# Patient Record
Sex: Male | Born: 1950 | Race: White | Hispanic: No | Marital: Married | Smoking: Current every day smoker
Health system: Southern US, Community
[De-identification: ages and names within clinical notes are randomized; demographics above are authoritative.]

## PROBLEM LIST (undated history)

## (undated) DIAGNOSIS — I10 Essential (primary) hypertension: Secondary | ICD-10-CM

## (undated) DIAGNOSIS — Z720 Tobacco use: Secondary | ICD-10-CM

## (undated) DIAGNOSIS — T783XXA Angioneurotic edema, initial encounter: Secondary | ICD-10-CM

## (undated) HISTORY — DX: Angioneurotic edema, initial encounter: T78.3XXA

## (undated) HISTORY — DX: Tobacco use: Z72.0

## (undated) HISTORY — DX: Essential (primary) hypertension: I10

## (undated) HISTORY — PX: APPENDECTOMY: SHX54

---

## 2001-07-21 ENCOUNTER — Inpatient Hospital Stay (HOSPITAL_COMMUNITY): Admission: EM | Admit: 2001-07-21 | Discharge: 2001-08-01 | Payer: Self-pay | Admitting: *Deleted

## 2001-07-31 ENCOUNTER — Encounter: Payer: Self-pay | Admitting: General Surgery

## 2015-05-26 ENCOUNTER — Other Ambulatory Visit: Payer: Self-pay | Admitting: Gastroenterology

## 2015-09-12 DIAGNOSIS — H6092 Unspecified otitis externa, left ear: Secondary | ICD-10-CM | POA: Diagnosis not present

## 2015-11-25 DIAGNOSIS — F418 Other specified anxiety disorders: Secondary | ICD-10-CM | POA: Diagnosis not present

## 2015-11-25 DIAGNOSIS — N401 Enlarged prostate with lower urinary tract symptoms: Secondary | ICD-10-CM | POA: Diagnosis not present

## 2015-11-25 DIAGNOSIS — Z79899 Other long term (current) drug therapy: Secondary | ICD-10-CM | POA: Diagnosis not present

## 2015-11-25 DIAGNOSIS — Z125 Encounter for screening for malignant neoplasm of prostate: Secondary | ICD-10-CM | POA: Diagnosis not present

## 2015-11-25 DIAGNOSIS — I10 Essential (primary) hypertension: Secondary | ICD-10-CM | POA: Diagnosis not present

## 2015-11-25 DIAGNOSIS — Z1211 Encounter for screening for malignant neoplasm of colon: Secondary | ICD-10-CM | POA: Diagnosis not present

## 2016-04-27 DIAGNOSIS — L02811 Cutaneous abscess of head [any part, except face]: Secondary | ICD-10-CM | POA: Diagnosis not present

## 2016-04-27 DIAGNOSIS — L0390 Cellulitis, unspecified: Secondary | ICD-10-CM | POA: Diagnosis not present

## 2016-04-27 DIAGNOSIS — C44629 Squamous cell carcinoma of skin of left upper limb, including shoulder: Secondary | ICD-10-CM | POA: Diagnosis not present

## 2016-06-01 DIAGNOSIS — G4733 Obstructive sleep apnea (adult) (pediatric): Secondary | ICD-10-CM | POA: Diagnosis not present

## 2016-06-01 DIAGNOSIS — R5383 Other fatigue: Secondary | ICD-10-CM | POA: Diagnosis not present

## 2016-06-01 DIAGNOSIS — R0600 Dyspnea, unspecified: Secondary | ICD-10-CM | POA: Diagnosis not present

## 2016-06-02 DIAGNOSIS — G4733 Obstructive sleep apnea (adult) (pediatric): Secondary | ICD-10-CM | POA: Diagnosis not present

## 2016-06-08 DIAGNOSIS — E559 Vitamin D deficiency, unspecified: Secondary | ICD-10-CM | POA: Diagnosis not present

## 2016-07-06 DIAGNOSIS — R5383 Other fatigue: Secondary | ICD-10-CM | POA: Diagnosis not present

## 2016-07-06 DIAGNOSIS — J449 Chronic obstructive pulmonary disease, unspecified: Secondary | ICD-10-CM | POA: Diagnosis not present

## 2016-07-06 DIAGNOSIS — R0600 Dyspnea, unspecified: Secondary | ICD-10-CM | POA: Diagnosis not present

## 2016-07-06 DIAGNOSIS — G4733 Obstructive sleep apnea (adult) (pediatric): Secondary | ICD-10-CM | POA: Diagnosis not present

## 2016-07-23 DIAGNOSIS — G4733 Obstructive sleep apnea (adult) (pediatric): Secondary | ICD-10-CM | POA: Diagnosis not present

## 2016-08-02 DIAGNOSIS — Z79899 Other long term (current) drug therapy: Secondary | ICD-10-CM | POA: Diagnosis not present

## 2016-08-02 DIAGNOSIS — F418 Other specified anxiety disorders: Secondary | ICD-10-CM | POA: Diagnosis not present

## 2016-08-02 DIAGNOSIS — I10 Essential (primary) hypertension: Secondary | ICD-10-CM | POA: Diagnosis not present

## 2016-08-03 DIAGNOSIS — R0600 Dyspnea, unspecified: Secondary | ICD-10-CM | POA: Diagnosis not present

## 2016-08-03 DIAGNOSIS — R5383 Other fatigue: Secondary | ICD-10-CM | POA: Diagnosis not present

## 2016-08-03 DIAGNOSIS — G4733 Obstructive sleep apnea (adult) (pediatric): Secondary | ICD-10-CM | POA: Diagnosis not present

## 2016-08-12 DIAGNOSIS — R0600 Dyspnea, unspecified: Secondary | ICD-10-CM | POA: Diagnosis not present

## 2016-08-12 DIAGNOSIS — R5383 Other fatigue: Secondary | ICD-10-CM | POA: Diagnosis not present

## 2016-08-12 DIAGNOSIS — G4733 Obstructive sleep apnea (adult) (pediatric): Secondary | ICD-10-CM | POA: Diagnosis not present

## 2016-09-12 DIAGNOSIS — G4733 Obstructive sleep apnea (adult) (pediatric): Secondary | ICD-10-CM | POA: Diagnosis not present

## 2016-09-12 DIAGNOSIS — R0600 Dyspnea, unspecified: Secondary | ICD-10-CM | POA: Diagnosis not present

## 2016-09-12 DIAGNOSIS — R5383 Other fatigue: Secondary | ICD-10-CM | POA: Diagnosis not present

## 2016-09-21 DIAGNOSIS — R5383 Other fatigue: Secondary | ICD-10-CM | POA: Diagnosis not present

## 2016-09-21 DIAGNOSIS — R0600 Dyspnea, unspecified: Secondary | ICD-10-CM | POA: Diagnosis not present

## 2016-09-21 DIAGNOSIS — G4733 Obstructive sleep apnea (adult) (pediatric): Secondary | ICD-10-CM | POA: Diagnosis not present

## 2016-09-22 DIAGNOSIS — G4733 Obstructive sleep apnea (adult) (pediatric): Secondary | ICD-10-CM | POA: Diagnosis not present

## 2016-10-13 DIAGNOSIS — R5383 Other fatigue: Secondary | ICD-10-CM | POA: Diagnosis not present

## 2016-10-13 DIAGNOSIS — G4733 Obstructive sleep apnea (adult) (pediatric): Secondary | ICD-10-CM | POA: Diagnosis not present

## 2016-10-13 DIAGNOSIS — R0600 Dyspnea, unspecified: Secondary | ICD-10-CM | POA: Diagnosis not present

## 2016-11-10 DIAGNOSIS — R5383 Other fatigue: Secondary | ICD-10-CM | POA: Diagnosis not present

## 2016-11-10 DIAGNOSIS — R0600 Dyspnea, unspecified: Secondary | ICD-10-CM | POA: Diagnosis not present

## 2016-11-10 DIAGNOSIS — G4733 Obstructive sleep apnea (adult) (pediatric): Secondary | ICD-10-CM | POA: Diagnosis not present

## 2016-12-11 DIAGNOSIS — R5383 Other fatigue: Secondary | ICD-10-CM | POA: Diagnosis not present

## 2016-12-11 DIAGNOSIS — G4733 Obstructive sleep apnea (adult) (pediatric): Secondary | ICD-10-CM | POA: Diagnosis not present

## 2016-12-11 DIAGNOSIS — R0600 Dyspnea, unspecified: Secondary | ICD-10-CM | POA: Diagnosis not present

## 2017-01-10 DIAGNOSIS — R5383 Other fatigue: Secondary | ICD-10-CM | POA: Diagnosis not present

## 2017-01-10 DIAGNOSIS — R0600 Dyspnea, unspecified: Secondary | ICD-10-CM | POA: Diagnosis not present

## 2017-01-10 DIAGNOSIS — G4733 Obstructive sleep apnea (adult) (pediatric): Secondary | ICD-10-CM | POA: Diagnosis not present

## 2017-02-10 DIAGNOSIS — R5383 Other fatigue: Secondary | ICD-10-CM | POA: Diagnosis not present

## 2017-02-10 DIAGNOSIS — R0600 Dyspnea, unspecified: Secondary | ICD-10-CM | POA: Diagnosis not present

## 2017-02-10 DIAGNOSIS — G4733 Obstructive sleep apnea (adult) (pediatric): Secondary | ICD-10-CM | POA: Diagnosis not present

## 2017-03-12 DIAGNOSIS — R0600 Dyspnea, unspecified: Secondary | ICD-10-CM | POA: Diagnosis not present

## 2017-03-12 DIAGNOSIS — G4733 Obstructive sleep apnea (adult) (pediatric): Secondary | ICD-10-CM | POA: Diagnosis not present

## 2017-03-12 DIAGNOSIS — R5383 Other fatigue: Secondary | ICD-10-CM | POA: Diagnosis not present

## 2017-04-12 DIAGNOSIS — R0600 Dyspnea, unspecified: Secondary | ICD-10-CM | POA: Diagnosis not present

## 2017-04-12 DIAGNOSIS — R5383 Other fatigue: Secondary | ICD-10-CM | POA: Diagnosis not present

## 2017-04-12 DIAGNOSIS — G4733 Obstructive sleep apnea (adult) (pediatric): Secondary | ICD-10-CM | POA: Diagnosis not present

## 2017-05-13 DIAGNOSIS — G4733 Obstructive sleep apnea (adult) (pediatric): Secondary | ICD-10-CM | POA: Diagnosis not present

## 2017-05-13 DIAGNOSIS — R5383 Other fatigue: Secondary | ICD-10-CM | POA: Diagnosis not present

## 2017-05-13 DIAGNOSIS — R0600 Dyspnea, unspecified: Secondary | ICD-10-CM | POA: Diagnosis not present

## 2017-05-23 DIAGNOSIS — I491 Atrial premature depolarization: Secondary | ICD-10-CM | POA: Diagnosis not present

## 2017-05-23 DIAGNOSIS — F418 Other specified anxiety disorders: Secondary | ICD-10-CM | POA: Diagnosis not present

## 2017-05-23 DIAGNOSIS — I10 Essential (primary) hypertension: Secondary | ICD-10-CM | POA: Diagnosis not present

## 2017-05-23 DIAGNOSIS — Z79899 Other long term (current) drug therapy: Secondary | ICD-10-CM | POA: Diagnosis not present

## 2017-06-12 DIAGNOSIS — R0600 Dyspnea, unspecified: Secondary | ICD-10-CM | POA: Diagnosis not present

## 2017-06-12 DIAGNOSIS — R5383 Other fatigue: Secondary | ICD-10-CM | POA: Diagnosis not present

## 2017-06-12 DIAGNOSIS — G4733 Obstructive sleep apnea (adult) (pediatric): Secondary | ICD-10-CM | POA: Diagnosis not present

## 2017-07-13 DIAGNOSIS — G4733 Obstructive sleep apnea (adult) (pediatric): Secondary | ICD-10-CM | POA: Diagnosis not present

## 2017-07-13 DIAGNOSIS — R0600 Dyspnea, unspecified: Secondary | ICD-10-CM | POA: Diagnosis not present

## 2017-07-13 DIAGNOSIS — R5383 Other fatigue: Secondary | ICD-10-CM | POA: Diagnosis not present

## 2017-07-28 DIAGNOSIS — Z86010 Personal history of colonic polyps: Secondary | ICD-10-CM | POA: Diagnosis not present

## 2017-07-28 DIAGNOSIS — K625 Hemorrhage of anus and rectum: Secondary | ICD-10-CM | POA: Diagnosis not present

## 2017-08-12 DIAGNOSIS — R0600 Dyspnea, unspecified: Secondary | ICD-10-CM | POA: Diagnosis not present

## 2017-08-12 DIAGNOSIS — R5383 Other fatigue: Secondary | ICD-10-CM | POA: Diagnosis not present

## 2017-08-12 DIAGNOSIS — G4733 Obstructive sleep apnea (adult) (pediatric): Secondary | ICD-10-CM | POA: Diagnosis not present

## 2017-08-30 DIAGNOSIS — J209 Acute bronchitis, unspecified: Secondary | ICD-10-CM | POA: Diagnosis not present

## 2017-09-06 DIAGNOSIS — D126 Benign neoplasm of colon, unspecified: Secondary | ICD-10-CM | POA: Diagnosis not present

## 2017-09-06 DIAGNOSIS — Z86010 Personal history of colonic polyps: Secondary | ICD-10-CM | POA: Diagnosis not present

## 2017-09-06 DIAGNOSIS — K635 Polyp of colon: Secondary | ICD-10-CM | POA: Diagnosis not present

## 2017-09-12 DIAGNOSIS — K635 Polyp of colon: Secondary | ICD-10-CM | POA: Diagnosis not present

## 2017-09-12 DIAGNOSIS — D126 Benign neoplasm of colon, unspecified: Secondary | ICD-10-CM | POA: Diagnosis not present

## 2017-10-25 DIAGNOSIS — N400 Enlarged prostate without lower urinary tract symptoms: Secondary | ICD-10-CM | POA: Diagnosis not present

## 2017-10-25 DIAGNOSIS — Z125 Encounter for screening for malignant neoplasm of prostate: Secondary | ICD-10-CM | POA: Diagnosis not present

## 2018-01-17 DIAGNOSIS — H2513 Age-related nuclear cataract, bilateral: Secondary | ICD-10-CM | POA: Diagnosis not present

## 2018-01-17 DIAGNOSIS — H2512 Age-related nuclear cataract, left eye: Secondary | ICD-10-CM | POA: Diagnosis not present

## 2018-01-26 DIAGNOSIS — Z79899 Other long term (current) drug therapy: Secondary | ICD-10-CM | POA: Diagnosis not present

## 2018-01-26 DIAGNOSIS — I10 Essential (primary) hypertension: Secondary | ICD-10-CM | POA: Diagnosis not present

## 2018-01-26 DIAGNOSIS — T783XXA Angioneurotic edema, initial encounter: Secondary | ICD-10-CM | POA: Diagnosis not present

## 2018-01-26 DIAGNOSIS — F418 Other specified anxiety disorders: Secondary | ICD-10-CM | POA: Diagnosis not present

## 2018-01-30 DIAGNOSIS — H2512 Age-related nuclear cataract, left eye: Secondary | ICD-10-CM | POA: Diagnosis not present

## 2018-01-30 DIAGNOSIS — H25812 Combined forms of age-related cataract, left eye: Secondary | ICD-10-CM | POA: Diagnosis not present

## 2018-02-06 DIAGNOSIS — H25811 Combined forms of age-related cataract, right eye: Secondary | ICD-10-CM | POA: Diagnosis not present

## 2018-02-06 DIAGNOSIS — H2511 Age-related nuclear cataract, right eye: Secondary | ICD-10-CM | POA: Diagnosis not present

## 2018-02-13 DIAGNOSIS — R5383 Other fatigue: Secondary | ICD-10-CM | POA: Diagnosis not present

## 2018-02-13 DIAGNOSIS — G4733 Obstructive sleep apnea (adult) (pediatric): Secondary | ICD-10-CM | POA: Diagnosis not present

## 2018-02-13 DIAGNOSIS — R0600 Dyspnea, unspecified: Secondary | ICD-10-CM | POA: Diagnosis not present

## 2018-03-13 DIAGNOSIS — T783XXA Angioneurotic edema, initial encounter: Secondary | ICD-10-CM | POA: Diagnosis not present

## 2018-03-13 DIAGNOSIS — I10 Essential (primary) hypertension: Secondary | ICD-10-CM | POA: Diagnosis not present

## 2018-03-13 DIAGNOSIS — Z79899 Other long term (current) drug therapy: Secondary | ICD-10-CM | POA: Diagnosis not present

## 2018-09-19 ENCOUNTER — Ambulatory Visit (INDEPENDENT_AMBULATORY_CARE_PROVIDER_SITE_OTHER): Payer: Medicare HMO | Admitting: Allergy and Immunology

## 2018-09-19 ENCOUNTER — Encounter: Payer: Self-pay | Admitting: Allergy and Immunology

## 2018-09-19 VITALS — BP 142/52 | HR 80 | Temp 98.7°F | Resp 20 | Ht 69.5 in | Wt 276.4 lb

## 2018-09-19 DIAGNOSIS — T7840XD Allergy, unspecified, subsequent encounter: Secondary | ICD-10-CM

## 2018-09-19 DIAGNOSIS — L501 Idiopathic urticaria: Secondary | ICD-10-CM | POA: Diagnosis not present

## 2018-09-19 DIAGNOSIS — T783XXD Angioneurotic edema, subsequent encounter: Secondary | ICD-10-CM

## 2018-09-19 MED ORDER — EPINEPHRINE 0.3 MG/0.3ML IJ SOAJ: INTRAMUSCULAR | 3 refills | Status: AC

## 2018-09-19 NOTE — Patient Instructions (Addendum)
  1.  Prednisone 30 mg today  2.  Cetirizine 20 mg today  3.  Continue Benadryl as needed  4.  EpiPen, Benadryl, MD/ER evaluation for allergic reaction  5.  Review blood tests from Dr. Wendie Agreste:  Further testing?

## 2018-09-19 NOTE — Progress Notes (Signed)
NEW PATIENT NOTE  Referring Provider: No ref. provider found Primary Provider: Enid Skeens., MD Date of office visit: 09/19/2018    Subjective:   Chief Complaint:  Ryan Combs (DOB: 09-Oct-1950) is a 68 y.o. male who presents to the clinic on 09/19/2018 with a chief complaint of Angioedema .  HPI: [name] presents to this clinic in evaluation of swelling reactions.  He did not have an scheduled appointment today and just walked in stating that his eyes are swelling.  Apparently he has a history dating back over 20 years when he developed hives that was associated with facial swelling for which he was evaluated in the emergency room setting and apparently had some skin test performed which did not really identify any significant etiologic factor contributing to this issue.  Over the course of the past 4 years he has been having recurrent swelling of his tongue and lips and most recently his eye.  Apparently his episodes usually occurs while sleeping.  He will take some Benadryl and usually within 4 hours or so his reaction is much better.  However, it probably takes a few days for all the swelling to resolve.  He can go months without a problem and then he can develop recurrent episodes within a week.  There is no other associated systemic or constitutional symptoms.  There is no obvious provoking factor giving rise to this issue.  There does not appear to be any association with a food consumption or a supplement consumption or a medication use or an environmental change.  Apparently he had a "blood test" performed by Dr. Wendie Agreste last year in investigation of this issue and this was normal.  He also has issues with wheezing and coughing and nasal congestion that has been a recurrent issue throughout the year and has been present now for about 3 to 4 weeks.  Apparently he was treated with prednisone and then subsequently and has injection of steroids by Dr. Wendie Agreste recently and he  has been given inhalers in the past which he does not use because he has not really found them to be effective.  He does smoke at least 1 pack/day and has done so for over 50 years.  He does not have a significant amount of issues to suggest chronic infection such as ugly nasal discharge or anosmia or ugly sputum production or recurrent fevers.  Past Medical History:  Diagnosis Date  . Angio-edema   . High blood pressure   . Tobacco abuse     Past Surgical History:  Procedure Laterality Date  . APPENDECTOMY      Allergies as of 09/19/2018   No Known Allergies     Medication List      amLODipine 5 MG tablet Commonly known as:  NORVASC TAKE 1 TABLET BY MOUTH ONCE DAILY FOR 30 DAYS   clonazePAM 1 MG tablet Commonly known as:  KLONOPIN Take 1 mg by mouth 3 (three) times daily.       Review of systems negative except as noted in HPI / PMHx or noted below:  Review of Systems  Constitutional: Negative.   HENT: Negative.   Eyes: Negative.   Respiratory: Negative.   Cardiovascular: Negative.   Gastrointestinal: Negative.   Genitourinary: Negative.   Musculoskeletal: Negative.   Skin: Negative.   Neurological: Negative.   Endo/Heme/Allergies: Negative.   Psychiatric/Behavioral: Negative.     Family History  Problem Relation Age of Onset  . Pancreatic cancer Father  Social History   Socioeconomic History  . Marital status: Married    Spouse name: Not on file  . Number of children: Not on file  . Years of education: Not on file  . Highest education level: Not on file  Occupational History  . Not on file  Social Needs  . Financial resource strain: Not on file  . Food insecurity:    Worry: Not on file    Inability: Not on file  . Transportation needs:    Medical: Not on file    Non-medical: Not on file  Tobacco Use  . Smoking status: Current Every Day Smoker    Packs/day: 1.00    Years: 50.00    Pack years: 50.00    Types: Cigarettes  . Smokeless  tobacco: Never Used  Substance and Sexual Activity  . Alcohol use: Never    Frequency: Never  . Drug use: Never  . Sexual activity: Not on file  Lifestyle  . Physical activity:    Days per week: Not on file    Minutes per session: Not on file  . Stress: Not on file  Relationships  . Social connections:    Talks on phone: Not on file    Gets together: Not on file    Attends religious service: Not on file    Active member of club or organization: Not on file    Attends meetings of clubs or organizations: Not on file    Relationship status: Not on file  . Intimate partner violence:    Fear of current or ex partner: Not on file    Emotionally abused: Not on file    Physically abused: Not on file    Forced sexual activity: Not on file  Other Topics Concern  . Not on file  Social History Narrative  . Not on file    Environmental and Social history  Lives in a house with a dry environment, no animals located inside the household, no carpet in the bedroom, no plastic on the bed, no plastic on the pillow, and actively smoking tobacco products.  He is a Art gallery manager.  Objective:   Vitals:   09/19/18 1145  BP: (!) 142/52  Pulse: 80  Resp: 20  Temp: 98.7 F (37.1 C)   Height: 5' 9.5" (176.5 cm) Weight: 276 lb 6.4 oz (125.4 kg)  Physical Exam Constitutional:      Appearance: He is not diaphoretic.  HENT:     Right Ear: Tympanic membrane, ear canal and external ear normal.     Left Ear: Tympanic membrane, ear canal and external ear normal.     Nose: Nose normal. No mucosal edema or rhinorrhea.     Mouth/Throat:     Pharynx: Uvula midline. No oropharyngeal exudate.  Eyes:     Extraocular Movements: Extraocular movements intact.     Conjunctiva/sclera: Conjunctivae normal.     Comments: Very edematous left upper lid extending into forehead right, left greater than right, with very slight erythema without any tenderness   Neck:     Thyroid: No thyromegaly.     Trachea: Trachea  normal. No tracheal tenderness or tracheal deviation.  Cardiovascular:     Rate and Rhythm: Normal rate and regular rhythm.     Heart sounds: Normal heart sounds, S1 normal and S2 normal. No murmur.  Pulmonary:     Effort: No respiratory distress.     Breath sounds: Normal breath sounds. No stridor. No wheezing or rales.  Lymphadenopathy:  Head:     Right side of head: No tonsillar adenopathy.     Left side of head: No tonsillar adenopathy.     Cervical: No cervical adenopathy.  Skin:    Findings: No erythema or rash.     Nails: There is no clubbing.   Neurological:     Mental Status: He is alert.     Diagnostics: Allergy skin tests were not performed.   Assessment and Plan:    1. Allergic reaction, subsequent encounter   2. Angioedema, subsequent encounter   3. Idiopathic urticaria     1.  Prednisone 30 mg today  2.  Cetirizine 20 mg today  3.  Continue Benadryl as needed  4.  EpiPen, Benadryl, MD/ER evaluation for allergic reaction  5.  Review blood tests from Dr. Wendie Agreste:  Further testing?  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  I will see what type of blood tests have been performed by his primary care doctor prior to proceeding with any further evaluation for systemic disease contributing to this immunological hyperactivity.  I will see him back in this clinic pending his response to therapy noted above and the results of further diagnostic evaluation.  He also appears to have significant issues with his airway most likely secondary to tobacco use and after our discussion today it does not appear as though that hobby is going to discontinue anytime soon.  Allena Katz, MD Allergy / Immunology [place]

## 2018-09-20 ENCOUNTER — Encounter: Payer: Self-pay | Admitting: Allergy and Immunology

## 2018-09-21 ENCOUNTER — Other Ambulatory Visit: Payer: Self-pay | Admitting: *Deleted

## 2018-09-21 ENCOUNTER — Telehealth: Payer: Self-pay | Admitting: *Deleted

## 2018-09-21 DIAGNOSIS — T7840XD Allergy, unspecified, subsequent encounter: Secondary | ICD-10-CM

## 2018-09-21 DIAGNOSIS — L501 Idiopathic urticaria: Secondary | ICD-10-CM

## 2018-09-21 DIAGNOSIS — T783XXD Angioneurotic edema, subsequent encounter: Secondary | ICD-10-CM

## 2018-09-21 NOTE — Telephone Encounter (Addendum)
Sent lab order to [facility]. Pt informed.     ----- Message from Jiles Prows, MD sent at 09/20/2018  4:56 PM EST ----- Please inform patient: needs CBC w/diff, TSH, T4, TP, Alpha-gal, C4

## 2018-09-28 LAB — CBC WITH DIFFERENTIAL/PLATELET
Basophils Absolute: 0.1 10*3/uL (ref 0.0–0.2)
Basos: 1 %
EOS (ABSOLUTE): 0.3 10*3/uL (ref 0.0–0.4)
Eos: 3 %
Hematocrit: 41.3 % (ref 37.5–51.0)
Hemoglobin: 13.4 g/dL (ref 13.0–17.7)
Immature Grans (Abs): 0 10*3/uL (ref 0.0–0.1)
Immature Granulocytes: 0 %
Lymphocytes Absolute: 1.8 10*3/uL (ref 0.7–3.1)
Lymphs: 18 %
MCH: 29 pg (ref 26.6–33.0)
MCHC: 32.4 g/dL (ref 31.5–35.7)
MCV: 89 fL (ref 79–97)
Monocytes Absolute: 0.7 10*3/uL (ref 0.1–0.9)
Monocytes: 7 %
NEUTROS ABS: 7 10*3/uL (ref 1.4–7.0)
Neutrophils: 71 %
Platelets: 303 10*3/uL (ref 150–450)
RBC: 4.62 x10E6/uL (ref 4.14–5.80)
RDW: 13.3 % (ref 11.6–15.4)
WBC: 9.9 10*3/uL (ref 3.4–10.8)

## 2018-09-28 LAB — ALPHA-GAL PANEL
Beef (Bos spp) IgE: <0.1 kU/L (ref <0.35)
Class Interpretation: 0
Class Interpretation: 0
Lamb/Mutton (Ovis spp) IgE: 0.25 kU/L (ref <0.35)
Pork (Sus spp) IgE: <0.1 kU/L (ref <0.35)

## 2018-09-28 LAB — TSH+FREE T4
Free T4: 1.24 ng/dL (ref 0.82–1.77)
TSH: 1.24 u[IU]/mL (ref 0.450–4.500)

## 2018-09-28 LAB — THYROID PEROXIDASE ANTIBODY: Thyroperoxidase Ab SerPl-aCnc: 20 IU/mL (ref 0–34)

## 2018-09-28 LAB — C4 COMPLEMENT: Complement C4, Serum: 39 mg/dL (ref 14–44)

## 2020-10-06 ENCOUNTER — Other Ambulatory Visit: Payer: Self-pay

## 2020-10-06 ENCOUNTER — Emergency Department (HOSPITAL_COMMUNITY): Payer: Medicare HMO

## 2020-10-06 ENCOUNTER — Inpatient Hospital Stay (HOSPITAL_COMMUNITY)
Admission: EM | Admit: 2020-10-06 | Discharge: 2020-10-08 | DRG: 948 | Disposition: A | Discharge status: Home / Self Care | Payer: Medicare HMO | Attending: Neurology | Admitting: Neurology

## 2020-10-06 ENCOUNTER — Encounter (HOSPITAL_COMMUNITY): Payer: Self-pay | Admitting: Radiology

## 2020-10-06 DIAGNOSIS — R410 Disorientation, unspecified: Principal | ICD-10-CM | POA: Diagnosis present

## 2020-10-06 DIAGNOSIS — I639 Cerebral infarction, unspecified: Principal | ICD-10-CM

## 2020-10-06 DIAGNOSIS — I6503 Occlusion and stenosis of bilateral vertebral arteries: Secondary | ICD-10-CM | POA: Diagnosis present

## 2020-10-06 DIAGNOSIS — U071 COVID-19: Secondary | ICD-10-CM | POA: Diagnosis present

## 2020-10-06 DIAGNOSIS — N401 Enlarged prostate with lower urinary tract symptoms: Secondary | ICD-10-CM | POA: Diagnosis present

## 2020-10-06 DIAGNOSIS — F1210 Cannabis abuse, uncomplicated: Secondary | ICD-10-CM | POA: Diagnosis present

## 2020-10-06 DIAGNOSIS — R471 Dysarthria and anarthria: Secondary | ICD-10-CM | POA: Diagnosis present

## 2020-10-06 DIAGNOSIS — R4701 Aphasia: Secondary | ICD-10-CM | POA: Diagnosis present

## 2020-10-06 DIAGNOSIS — I63019 Cerebral infarction due to thrombosis of unspecified vertebral artery: Secondary | ICD-10-CM | POA: Diagnosis not present

## 2020-10-06 DIAGNOSIS — Z6839 Body mass index (BMI) 39.0-39.9, adult: Secondary | ICD-10-CM | POA: Diagnosis not present

## 2020-10-06 DIAGNOSIS — E785 Hyperlipidemia, unspecified: Secondary | ICD-10-CM | POA: Diagnosis present

## 2020-10-06 DIAGNOSIS — Z8616 Personal history of COVID-19: Secondary | ICD-10-CM

## 2020-10-06 DIAGNOSIS — R2990 Unspecified symptoms and signs involving the nervous system: Secondary | ICD-10-CM

## 2020-10-06 DIAGNOSIS — I6300 Cerebral infarction due to thrombosis of unspecified precerebral artery: Secondary | ICD-10-CM | POA: Diagnosis not present

## 2020-10-06 DIAGNOSIS — G8194 Hemiplegia, unspecified affecting left nondominant side: Secondary | ICD-10-CM | POA: Diagnosis present

## 2020-10-06 DIAGNOSIS — F17210 Nicotine dependence, cigarettes, uncomplicated: Secondary | ICD-10-CM | POA: Diagnosis present

## 2020-10-06 DIAGNOSIS — J309 Allergic rhinitis, unspecified: Secondary | ICD-10-CM | POA: Diagnosis present

## 2020-10-06 DIAGNOSIS — I10 Essential (primary) hypertension: Secondary | ICD-10-CM | POA: Diagnosis present

## 2020-10-06 DIAGNOSIS — I6389 Other cerebral infarction: Secondary | ICD-10-CM | POA: Diagnosis not present

## 2020-10-06 DIAGNOSIS — R531 Weakness: Secondary | ICD-10-CM | POA: Diagnosis present

## 2020-10-06 DIAGNOSIS — F419 Anxiety disorder, unspecified: Secondary | ICD-10-CM | POA: Diagnosis present

## 2020-10-06 DIAGNOSIS — R29705 NIHSS score 5: Secondary | ICD-10-CM | POA: Diagnosis present

## 2020-10-06 DIAGNOSIS — E669 Obesity, unspecified: Secondary | ICD-10-CM | POA: Diagnosis present

## 2020-10-06 DIAGNOSIS — J449 Chronic obstructive pulmonary disease, unspecified: Secondary | ICD-10-CM | POA: Diagnosis present

## 2020-10-06 LAB — AMMONIA: Ammonia: 29 umol/L (ref 9–35)

## 2020-10-06 LAB — I-STAT CHEM 8, ED
BUN: 13 mg/dL (ref 8–23)
Calcium, Ion: 1.05 mmol/L — ABNORMAL LOW (ref 1.15–1.40)
Chloride: 104 mmol/L (ref 98–111)
Creatinine, Ser: 0.8 mg/dL (ref 0.61–1.24)
Glucose, Bld: 128 mg/dL — ABNORMAL HIGH (ref 70–99)
HCT: 43 % (ref 39.0–52.0)
Hemoglobin: 14.6 g/dL (ref 13.0–17.0)
Potassium: 3.6 mmol/L (ref 3.5–5.1)
Sodium: 141 mmol/L (ref 135–145)
TCO2: 25 mmol/L (ref 22–32)

## 2020-10-06 LAB — DIFFERENTIAL
Abs Immature Granulocytes: 0.13 10*3/uL — ABNORMAL HIGH (ref 0.00–0.07)
Basophils Absolute: 0.1 10*3/uL (ref 0.0–0.1)
Basophils Relative: 0 %
Eosinophils Absolute: 0.2 10*3/uL (ref 0.0–0.5)
Eosinophils Relative: 2 %
Immature Granulocytes: 1 %
Lymphocytes Relative: 22 %
Lymphs Abs: 2.5 10*3/uL (ref 0.7–4.0)
Monocytes Absolute: 1.1 10*3/uL — ABNORMAL HIGH (ref 0.1–1.0)
Monocytes Relative: 10 %
Neutro Abs: 7.2 10*3/uL (ref 1.7–7.7)
Neutrophils Relative %: 65 %

## 2020-10-06 LAB — CBC
HCT: 45.4 % (ref 39.0–52.0)
Hemoglobin: 14.5 g/dL (ref 13.0–17.0)
MCH: 30.2 pg (ref 26.0–34.0)
MCHC: 31.9 g/dL (ref 30.0–36.0)
MCV: 94.6 fL (ref 80.0–100.0)
Platelets: 225 10*3/uL (ref 150–400)
RBC: 4.8 MIL/uL (ref 4.22–5.81)
RDW: 13.9 % (ref 11.5–15.5)
WBC: 11.2 10*3/uL — ABNORMAL HIGH (ref 4.0–10.5)
nRBC: 0 % (ref 0.0–0.2)

## 2020-10-06 LAB — COMPREHENSIVE METABOLIC PANEL
ALT: 24 U/L (ref 0–44)
AST: 17 U/L (ref 15–41)
Albumin: 3.2 g/dL — ABNORMAL LOW (ref 3.5–5.0)
Alkaline Phosphatase: 78 U/L (ref 38–126)
Anion gap: 10 (ref 5–15)
BUN: 11 mg/dL (ref 8–23)
CO2: 27 mmol/L (ref 22–32)
Calcium: 8.8 mg/dL — ABNORMAL LOW (ref 8.9–10.3)
Chloride: 103 mmol/L (ref 98–111)
Creatinine, Ser: 0.88 mg/dL (ref 0.61–1.24)
GFR, Estimated: >60 mL/min (ref >60)
Glucose, Bld: 130 mg/dL — ABNORMAL HIGH (ref 70–99)
Potassium: 3.6 mmol/L (ref 3.5–5.1)
Sodium: 140 mmol/L (ref 135–145)
Total Bilirubin: 0.9 mg/dL (ref 0.3–1.2)
Total Protein: 6.5 g/dL (ref 6.5–8.1)

## 2020-10-06 LAB — MRSA PCR SCREENING: MRSA by PCR: NEGATIVE

## 2020-10-06 LAB — TSH: TSH: 0.359 u[IU]/mL (ref 0.350–4.500)

## 2020-10-06 LAB — RESP PANEL BY RT-PCR (FLU A&B, COVID) ARPGX2
Influenza A by PCR: NEGATIVE
Influenza B by PCR: NEGATIVE
SARS Coronavirus 2 by RT PCR: POSITIVE — AB

## 2020-10-06 LAB — APTT: aPTT: 30 seconds (ref 24–36)

## 2020-10-06 LAB — RAPID URINE DRUG SCREEN, HOSP PERFORMED
Amphetamines: NOT DETECTED
Barbiturates: NOT DETECTED
Benzodiazepines: POSITIVE — AB
Cocaine: NOT DETECTED
Opiates: NOT DETECTED
Tetrahydrocannabinol: POSITIVE — AB

## 2020-10-06 LAB — PROTIME-INR
INR: 1 (ref 0.8–1.2)
Prothrombin Time: 13.2 seconds (ref 11.4–15.2)

## 2020-10-06 LAB — CBG MONITORING, ED: Glucose-Capillary: 131 mg/dL — ABNORMAL HIGH (ref 70–99)

## 2020-10-06 MED ORDER — STROKE: EARLY STAGES OF RECOVERY BOOK
Freq: Once | Status: AC
  Filled 2020-10-06: qty 1

## 2020-10-06 MED ORDER — CLEVIDIPINE BUTYRATE 0.5 MG/ML IV EMUL: 0.0000 mg/h | INTRAVENOUS | Status: DC

## 2020-10-06 MED ORDER — ALTEPLASE (STROKE) FULL DOSE INFUSION
90.0000 mg | Freq: Once | INTRAVENOUS | Status: AC
  Administered 2020-10-06: 90 mg via INTRAVENOUS
  Filled 2020-10-06: qty 100

## 2020-10-06 MED ORDER — ACETAMINOPHEN 650 MG RE SUPP: 650.0000 mg | RECTAL | Status: DC | PRN

## 2020-10-06 MED ORDER — ACETAMINOPHEN 325 MG PO TABS
650.0000 mg | ORAL_TABLET | ORAL | Status: DC | PRN
  Administered 2020-10-08: 650 mg via ORAL
  Filled 2020-10-06: qty 2

## 2020-10-06 MED ORDER — SODIUM CHLORIDE 0.9% FLUSH
3.0000 mL | Freq: Once | INTRAVENOUS | Status: AC
Start: 2020-10-06 — End: 2020-10-06
  Administered 2020-10-06: 3 mL via INTRAVENOUS

## 2020-10-06 MED ORDER — SENNOSIDES-DOCUSATE SODIUM 8.6-50 MG PO TABS
1.0000 | ORAL_TABLET | Freq: Two times a day (BID) | ORAL | Status: DC
  Administered 2020-10-07: 1 via ORAL
  Filled 2020-10-06: qty 1

## 2020-10-06 MED ORDER — ACETAMINOPHEN 160 MG/5ML PO SOLN: 650.0000 mg | ORAL | Status: DC | PRN

## 2020-10-06 MED ORDER — LABETALOL HCL 5 MG/ML IV SOLN
10.0000 mg | Freq: Once | INTRAVENOUS | Status: DC | PRN
Start: 2020-10-06 — End: 2020-10-08

## 2020-10-06 MED ORDER — CHLORHEXIDINE GLUCONATE CLOTH 2 % EX PADS
6.0000 | MEDICATED_PAD | Freq: Every day | CUTANEOUS | Status: DC
  Administered 2020-10-06 – 2020-10-08 (×3): 6 via TOPICAL

## 2020-10-06 MED ORDER — IOHEXOL 350 MG/ML SOLN
75.0000 mL | Freq: Once | INTRAVENOUS | Status: AC | PRN
  Administered 2020-10-06: 75 mL via INTRAVENOUS

## 2020-10-06 MED ORDER — SODIUM CHLORIDE 0.9 % IV SOLN: INTRAVENOUS | Status: DC

## 2020-10-06 MED ORDER — PANTOPRAZOLE SODIUM 40 MG IV SOLR
40.0000 mg | Freq: Every day | INTRAVENOUS | Status: DC
Start: 2020-10-06 — End: 2020-10-08
  Administered 2020-10-06 – 2020-10-07 (×2): 40 mg via INTRAVENOUS
  Filled 2020-10-06 (×2): qty 40

## 2020-10-06 MED ORDER — SODIUM CHLORIDE 0.9 % IV SOLN
50.0000 mL | Freq: Once | INTRAVENOUS | Status: AC
  Administered 2020-10-06: 50 mL via INTRAVENOUS
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
## 2020-10-06 NOTE — ED Triage Notes (Signed)
Patient BIB [facility] from [employer]. Patient was working, went to get something out of the fridge, bent over and had sudden onset confusion and weakness.

## 2020-10-06 NOTE — ED Provider Notes (Signed)
[facility] EMERGENCY DEPARTMENT Provider Note   CSN: [id number] Arrival date & time: 10/06/20  1552  An emergency department physician performed an initial assessment on this suspected stroke patient at 1554.  History No chief complaint on file.   Ryan Combs is a 70 y.o. male.  HPI   This patient is a 70 year old male, he has a known history of high blood pressure, he is has been a smoker, he arrives by paramedic transport after having altered mental status this noon at 3:00 PM.  He had been back and forth from his barbershop today, his wife states that he was totally normal when he went back to the barbershop and according to the daughter who was with him at 3:00 at the barbershop he evidently collapsed and was having difficulty talking and completely agitated.  This was an acute change.  He did go to the dentist yesterday in process of having some partials made however he was not given any significant medications, no sedation and was totally normal when he went home last night and when he woke up this morning.  Code stroke was activated from the field due to this.  The patient is not wanting to talk very much, he seems to be moving all 4 extremities but cannot really communicate what is going on.  Level 5 caveat applies.  Neurology met the patient on arrival with me for formal evaluation  Past Medical History:  Diagnosis Date  . Angio-edema   . High blood pressure   . Tobacco abuse     Patient Active Problem List   Diagnosis Date Noted  . Stroke [facility]) 10/06/2020    Past Surgical History:  Procedure Laterality Date  . APPENDECTOMY         Family History  Problem Relation Age of Onset  . Pancreatic cancer Father     Social History   Tobacco Use  . Smoking status: Current Every Day Smoker    Packs/day: 1.00    Years: 50.00    Pack years: 50.00    Types: Cigarettes  . Smokeless tobacco: Never Used  Substance Use Topics  . Alcohol use: Never   . Drug use: Never    Home Medications Prior to Admission medications   Medication Sig Start Date End Date Taking? Authorizing Provider  amLODipine (NORVASC) 5 MG tablet TAKE 1 TABLET BY MOUTH ONCE DAILY FOR 30 DAYS 09/10/18   [provider]  clonazePAM (KLONOPIN) 1 MG tablet Take 1 mg by mouth 3 (three) times daily. 09/14/18   [provider]  EPINEPHrine 0.3 mg/0.3 mL IJ SOAJ injection Use as directed for life threatening allergic reactions only 09/19/18   Kozlow, Donnamarie Poag, MD    Allergies    Aspirin  Review of Systems   Review of Systems  Unable to perform ROS: Mental status change    Physical Exam Updated Vital Signs BP (!) 122/59   Pulse 71   Temp (!) 97.5 F (36.4 C) (Oral)   Resp 19   Ht 1.765 m (5' 9.5")   Wt 123.5 kg   SpO2 91%   BMI 39.63 kg/m   Physical Exam Vitals and nursing note reviewed.  Constitutional:      General: He is in acute distress.     Appearance: He is well-developed and well-nourished.     Comments: Very agitated  HENT:     Head: Normocephalic and atraumatic.     Mouth/Throat:     Mouth: Oropharynx is  clear and moist.     Pharynx: No oropharyngeal exudate.  Eyes:     General: No scleral icterus.       Right eye: No discharge.        Left eye: No discharge.     Extraocular Movements: EOM normal.     Conjunctiva/sclera: Conjunctivae normal.     Pupils: Pupils are equal, round, and reactive to light.  Neck:     Thyroid: No thyromegaly.     Vascular: No JVD.  Cardiovascular:     Rate and Rhythm: Normal rate and regular rhythm.     Pulses: Intact distal pulses.     Heart sounds: Normal heart sounds. No murmur heard. No friction rub. No gallop.   Pulmonary:     Effort: Pulmonary effort is normal. No respiratory distress.     Breath sounds: Normal breath sounds. No wheezing or rales.  Abdominal:     General: Bowel sounds are normal. There is no distension.     Palpations: Abdomen is soft. There is no mass.      Tenderness: There is no abdominal tenderness.  Musculoskeletal:        General: No tenderness or edema. Normal range of motion.     Cervical back: Normal range of motion and neck supple.     Right lower leg: No edema.     Left lower leg: No edema.  Lymphadenopathy:     Cervical: No cervical adenopathy.  Skin:    General: Skin is warm and dry.     Findings: No erythema or rash.  Neurological:     Mental Status: He is alert.     Comments: The patient has difficulty holding his legs up, he has a drop and catch type motion with both legs when he tries to lift them.  He is able to use both of his arms to follow commands but pushes people away when he feels irritated.  He will nod or blink answers to questions and seems to move his eyes across the midline bilaterally.  He does not readily answer questions verbally  Psychiatric:        Mood and Affect: Mood and affect normal.     Comments: Agitated     ED Results / Procedures / Treatments   Labs (all labs ordered are listed, but only abnormal results are displayed) Labs Reviewed  CBC - Abnormal; Notable for the following components:      Result Value   WBC 11.2 (*)    All other components within normal limits  DIFFERENTIAL - Abnormal; Notable for the following components:   Monocytes Absolute 1.1 (*)    Abs Immature Granulocytes 0.13 (*)    All other components within normal limits  COMPREHENSIVE METABOLIC PANEL - Abnormal; Notable for the following components:   Glucose, Bld 130 (*)    Calcium 8.8 (*)    Albumin 3.2 (*)    All other components within normal limits  I-STAT CHEM 8, ED - Abnormal; Notable for the following components:   Glucose, Bld 128 (*)    Calcium, Ion 1.05 (*)    All other components within normal limits  CBG MONITORING, ED - Abnormal; Notable for the following components:   Glucose-Capillary 131 (*)    All other components within normal limits  RESP PANEL BY RT-PCR (FLU A&B, COVID) ARPGX2  PROTIME-INR  APTT   AMMONIA  HIV ANTIBODY (ROUTINE TESTING W REFLEX)  TSH  HEMOGLOBIN A1C  LIPID PANEL  EKG EKG Interpretation  Date/Time:  Tuesday October 06 2020 17:10:45 EST Ventricular Rate:  67 PR Interval:    QRS Duration: 108 QT Interval:  427 QTC Calculation: 451 R Axis:   39 Text Interpretation: Sinus rhythm No old tracing to compare Confirmed by Noemi Chapel [phone]) on 10/06/2020 5:13:50 PM   Radiology CT HEAD CODE STROKE WO CONTRAST  Result Date: 10/06/2020 CLINICAL DATA:  Code stroke. EXAM: CT HEAD WITHOUT CONTRAST TECHNIQUE: Contiguous axial images were obtained from the base of the skull through the vertex without intravenous contrast. COMPARISON:  None. FINDINGS: Motion artifact is present despite repeat imaging. Lower slices are particularly degraded. Brain: There is no acute intracranial hemorrhage, mass effect, or edema. No loss of gray-white differentiation. Ventricles and sulci are within normal limits in size and configuration. Vascular: No definite hyperdense vessel. Skull: Unremarkable. Sinuses/Orbits: Poorly evaluated. Other: Poorly evaluated. IMPRESSION: No acute intracranial hemorrhage. No acute infarction, noting that inferior slices are significantly degraded by motion artifact. These results were communicated to Dr. Charlsie Merles at 4:14 pm on 10/06/2020 by text page via the [facility] messaging system. Electronically Signed   By: Macy Mis M.D.   On: 10/06/2020 16:21   CT ANGIO HEAD CODE STROKE  Result Date: 10/06/2020 CLINICAL DATA:  Neuro deficit, acute stroke suspected. Dizziness. Altered mental status. EXAM: CT ANGIOGRAPHY HEAD AND NECK TECHNIQUE: Multidetector CT imaging of the head and neck was performed using the standard protocol during bolus administration of intravenous contrast. Multiplanar CT image reconstructions and MIPs were obtained to evaluate the vascular anatomy. Carotid stenosis measurements (when applicable) are obtained utilizing NASCET criteria, using the  distal internal carotid diameter as the denominator. CONTRAST:  80m OMNIPAQUE IOHEXOL 350 MG/ML SOLN COMPARISON:  Same day CT head code stroke. FINDINGS: CTA NECK FINDINGS Aortic arch: Atherosclerosis.  Great vessel origins are patent. Right carotid system: Mixed calcific and noncalcific atherosclerosis at the bifurcation without evidence of greater than 50% stenosis. No evidence of dissection. Left carotid system: Mixed calcific and noncalcific atherosclerosis at the bifurcation without evidence of greater than 50% stenosis. No evidence of dissection. Vertebral arteries: Left dominant. At C6-C7 on the left there is focal approximately 60% narrowing of the left vertebral artery. The remainder of the left vertebral artery is patent. The right vertebral artery is diminutive throughout its course and not well visualized intradurally. Skeleton: Mild-to-moderate multilevel degenerative disc disease and facet arthropathy. No acute abnormality. Other neck: Bilateral parotid masses, measuring up to 1.6 cm on the left and 1.3 cm on the right. Upper chest: Emphysema. Lung apices are clear. Bronchial wall thickening. Review of the MIP images confirms the above findings CTA HEAD FINDINGS Anterior circulation: No large vessel occlusion, proximal hemodynamically significant stenosis, or aneurysm. 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 Posterior circulation: Left dominant vertebral artery is patent intradurally. Diminutive right vertebral artery is poorly visualized intradurally. Basilar arteries patent with mild stenosis distally by lateral fetal type PCAs with small P1 PCAs bilaterally. No evidence of significant proximal PCA stenosis. Evaluation distally is limited by venous contamination. Dysplastic appearance of the distal basilar artery without discrete aneurysm. Venous sinuses: As permitted by contrast timing, patent. Anatomic variants: Bilateral fetal type PCAs. Hypoplastic left A1 ACA with  large right A1 ACA. Review of the MIP images confirms the above findings IMPRESSION: 1. Intracranially, no evidence of emergent large vessel occlusion or proximal hemodynamically significant stenosis. 2. 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 The non dominant right vertebral artery is diminutive throughout its course and poorly visualized intradurally. 3. Nonspecific bilateral parotid masses. Given that these could be benign or malignant, recommend nonurgent ENT consultation for management. 4. Emphysema. Electronically Signed   By: Margaretha Sheffield MD   On: 10/06/2020 16:56   CT ANGIO NECK CODE STROKE  Result Date: 10/06/2020 CLINICAL DATA:  Neuro deficit, acute stroke suspected. Dizziness. Altered mental status. EXAM: CT ANGIOGRAPHY HEAD AND NECK TECHNIQUE: Multidetector CT imaging of the head and neck was performed using the standard protocol during bolus administration of intravenous contrast. Multiplanar CT image reconstructions and MIPs were obtained to evaluate the vascular anatomy. Carotid stenosis measurements (when applicable) are obtained utilizing NASCET criteria, using the distal internal carotid diameter as the denominator. CONTRAST:  62m OMNIPAQUE IOHEXOL 350 MG/ML SOLN COMPARISON:  Same day CT head code stroke. FINDINGS: CTA NECK FINDINGS Aortic arch: Atherosclerosis.  Great vessel origins are patent. Right carotid system: Mixed calcific and noncalcific atherosclerosis at the bifurcation without evidence of greater than 50% stenosis. No evidence of dissection. Left carotid system: Mixed calcific and noncalcific atherosclerosis at the bifurcation without evidence of greater than 50% stenosis. No evidence of dissection. Vertebral arteries: Left dominant. At C6-C7 on the left there is focal approximately 60% narrowing of the left vertebral artery. The remainder of the left vertebral artery is patent. The right vertebral artery is diminutive  throughout its course and not well visualized intradurally. Skeleton: Mild-to-moderate multilevel degenerative disc disease and facet arthropathy. No acute abnormality. Other neck: Bilateral parotid masses, measuring up to 1.6 cm on the left and 1.3 cm on the right. Upper chest: Emphysema. Lung apices are clear. Bronchial wall thickening. Review of the MIP images confirms the above findings CTA HEAD FINDINGS Anterior circulation: No large vessel occlusion, proximal hemodynamically significant stenosis, or aneurysm. 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 Posterior circulation: Left dominant vertebral artery is patent intradurally. Diminutive right vertebral artery is poorly visualized intradurally. Basilar arteries patent with mild stenosis distally by lateral fetal type PCAs with small P1 PCAs bilaterally. No evidence of significant proximal PCA stenosis. Evaluation distally is limited by venous contamination. Dysplastic appearance of the distal basilar artery without discrete aneurysm. Venous sinuses: As permitted by contrast timing, patent. Anatomic variants: Bilateral fetal type PCAs. Hypoplastic left A1 ACA with large right A1 ACA. Review of the MIP images confirms the above findings IMPRESSION: 1. Intracranially, no evidence of emergent large vessel occlusion or proximal hemodynamically significant stenosis. 2. 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 The non dominant right vertebral artery is diminutive throughout its course and poorly visualized intradurally. 3. Nonspecific bilateral parotid masses. Given that these could be benign or malignant, recommend nonurgent ENT consultation for management. 4. Emphysema. Electronically Signed   By: FMargaretha SheffieldMD   On: 10/06/2020 16:56    Procedures .Critical Care Performed by: MNoemi Chapel MD Authorized by: MNoemi Chapel MD   Critical care provider statement:     Critical care time (minutes):  35   Critical care time was exclusive of:  Separately billable procedures and treating other patients and teaching time   Critical care was necessary to treat or prevent imminent or life-threatening deterioration of the following conditions:  CNS failure or compromise   Critical care was time spent personally by me on the following activities:  Blood draw for specimens, development of treatment plan with patient or  surrogate, discussions with consultants, evaluation of patient's response to treatment, examination of patient, obtaining history from patient or surrogate, ordering and performing treatments and interventions, ordering and review of laboratory studies, ordering and review of radiographic studies, pulse oximetry, re-evaluation of patient's condition and review of old charts     Medications Ordered in ED Medications  alteplase (ACTIVASE) 1 mg/mL infusion 90 mg (90 mg Intravenous New Bag/Given 10/06/20 1634)    Followed by  0.9 %  sodium chloride infusion (has no administration in time range)  labetalol (NORMODYNE) injection 10 mg (has no administration in time range)    And  clevidipine (CLEVIPREX) infusion 0.5 mg/mL (0 mg/hr Intravenous Hold 10/06/20 1659)   stroke: mapping our early stages of recovery book (has no administration in time range)  0.9 %  sodium chloride infusion (has no administration in time range)  acetaminophen (TYLENOL) tablet 650 mg (has no administration in time range)    Or  acetaminophen (TYLENOL) 160 MG/5ML solution 650 mg (has no administration in time range)    Or  acetaminophen (TYLENOL) suppository 650 mg (has no administration in time range)  senna-docusate (Senokot-S) tablet 1 tablet (has no administration in time range)  pantoprazole (PROTONIX) injection 40 mg (has no administration in time range)  sodium chloride flush (NS) 0.9 % injection 3 mL (3 mLs Intravenous Given 10/06/20 1656)  iohexol (OMNIPAQUE) 350 MG/ML  injection 75 mL (75 mLs Intravenous Contrast Given 10/06/20 1624)    ED Course  I have reviewed the triage vital signs and the nursing notes.  Pertinent labs & imaging results that were available during my care of the patient were reviewed by me and considered in my medical decision making (see chart for details).    MDM Rules/Calculators/A&P                          This patient appears to be more encephalopathic than focal.  He will go for CT scan of the head as well as have lab work EKG, ammonia level, lactic acid.  Given the additional history the neurologist will talk to the family about giving TPA.  The CTs evidently do not show any signs of hemorrhage or large vessel occlusion.  The CBC is unremarkable other than a minimal leukocytosis and the original metabolic panel shows minimal hyperglycemia at 128 but otherwise normal renal function  EKG without acute findings, TPA has been given at this time  Patient is critically ill with what appears to be an acute ischemic stroke  Final Clinical Impression(s) / ED Diagnoses Final diagnoses:  Acute ischemic stroke [facility])      Noemi Chapel, MD 10/06/20 1714

## 2020-10-06 NOTE — Progress Notes (Addendum)
PHARMACIST CODE STROKE RESPONSE  Notified to mix tPA at 1629 by Dr. Charlsie Merles Delivered tPA to RN at 1633  tPA dose = 9mg  bolus over 1 minute followed by 81mg  for a total dose of 90mg  over 1 hour  Issues/delays encountered (if applicable): n/a  Dimple Nanas, PharmD PGY-1 Acute Care Pharmacy Resident Office: [phone] 10/06/2020 4:35 PM
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
## 2020-10-06 NOTE — ED Notes (Signed)
cbg 131

## 2020-10-06 NOTE — ED Notes (Signed)
Patient placed on 4L [place]

## 2020-10-06 NOTE — Progress Notes (Signed)
CRITICAL VALUE ALERT  Critical Value: Covid positive    Date & Time Notied:  10/06/2020 at 0700  Provider Notified: Dr. Leonel Ramsay   Orders Received/Actions taken: put patient on airborne and contact precautions

## 2020-10-07 ENCOUNTER — Inpatient Hospital Stay (HOSPITAL_COMMUNITY): Payer: Medicare HMO

## 2020-10-07 DIAGNOSIS — I6300 Cerebral infarction due to thrombosis of unspecified precerebral artery: Secondary | ICD-10-CM

## 2020-10-07 DIAGNOSIS — I63019 Cerebral infarction due to thrombosis of unspecified vertebral artery: Secondary | ICD-10-CM

## 2020-10-07 DIAGNOSIS — U071 COVID-19: Secondary | ICD-10-CM

## 2020-10-07 DIAGNOSIS — I6389 Other cerebral infarction: Secondary | ICD-10-CM

## 2020-10-07 LAB — LIPID PANEL
Cholesterol: 164 mg/dL (ref 0–200)
HDL: 36 mg/dL — ABNORMAL LOW (ref >40)
LDL Cholesterol: 113 mg/dL — ABNORMAL HIGH (ref 0–99)
Total CHOL/HDL Ratio: 4.6 RATIO
Triglycerides: 77 mg/dL (ref <150)
VLDL: 15 mg/dL (ref 0–40)

## 2020-10-07 LAB — ECHOCARDIOGRAM LIMITED
Area-P 1/2: 2.66 cm2
Height: 69.5 in
S' Lateral: 4.6 cm
Weight: 4356.29 oz

## 2020-10-07 LAB — ETHANOL: Alcohol, Ethyl (B): <10 mg/dL (ref <10)

## 2020-10-07 LAB — HIV ANTIBODY (ROUTINE TESTING W REFLEX): HIV Screen 4th Generation wRfx: NONREACTIVE

## 2020-10-07 MED ORDER — ATORVASTATIN CALCIUM 80 MG PO TABS
80.0000 mg | ORAL_TABLET | Freq: Every day | ORAL | Status: DC
  Administered 2020-10-07 – 2020-10-08 (×2): 80 mg via ORAL
  Filled 2020-10-07 (×2): qty 1

## 2020-10-07 NOTE — Progress Notes (Signed)
PT Cancellation Note  Patient Details Name: Ryan Combs MRN: [id number] DOB: [birth year]   Cancelled Treatment:    Reason Eval/Treat Not Completed: Active bedrest order - received TPA on 2/15 at 1633 as well, will check back.   Stacie Glaze, PT Acute Rehabilitation Services Pager [phone]  Office [phone]    [name] 10/07/2020, 8:30 AM

## 2020-10-07 NOTE — Research (Signed)
Discussed with Jannifer Hick the opportunity to participate in the OPTIMISTmain study. He understands it is for data collection purposes and will be required to participate in two questionnaires: one at time of discharge and another 90 days later. Information sheet provided. Patient expressed interest in helping others and is willing to participate with this study.   Leverne Humbles, RN

## 2020-10-07 NOTE — Evaluation (Signed)
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 The non dominant right vertebral artery is diminutive throughout its course and poorly visualized intradurally. TPA administered 2/15 at 1633. Also Covid-19 positive. PMH includes HTN, angioedema, COPD, anxiety, cataract, BPH with LUTS, allergic rhinitis, and tobacco abuse.  Clinical Impression   Pt presents with possible R inattention, impaired cognition vs baseline, generalized weakness, dyspnea on exertion with SpO2 85% on RA during gait, and impaired activity tolerance. Pt to benefit from acute PT to address deficits. Pt ambulated short hallway distance, overall requiring min +2 assist for mobility tasks at this time. PT expects pt to progress well, OPPT recommended with support from spouse at d/c. PT to progress mobility as tolerated, and will continue to follow acutely.      Follow Up Recommendations Outpatient PT;Supervision for mobility/OOB    Equipment Recommendations  None recommended by PT    Recommendations for Other Services       Precautions / Restrictions Precautions Precautions: Fall Restrictions Weight Bearing Restrictions: No      Mobility  Bed Mobility Overal bed mobility: Needs Assistance Bed Mobility: Supine to Sit     Supine to sit: HOB elevated;Min assist     General bed mobility comments: min assist for trunk elevation, scooting to EOB. Pt performed in very step-wise fashion.    Transfers Overall transfer level: Needs assistance Equipment used: 1 person hand held assist Transfers: Sit to/from Stand Sit to Stand: Min assist;From elevated surface          General transfer comment: min assist to steady upon standing, STS X2 from EOB and recliner  Ambulation/Gait Ambulation/Gait assistance: Min assist;+2 safety/equipment Gait Distance (Feet): 70 Feet Assistive device: None;1 person hand held assist Gait Pattern/deviations: Step-through pattern;Decreased stride length;Trunk flexed Gait velocity: decr   General Gait Details: min assist to steady, +2 for lines/leads. Pt noted to bump into objects on R x2 during mobility. SpO2 85% on RA during gait, 2LO2 reapplied.  Stairs            Wheelchair Mobility    Modified Rankin (Stroke Patients Only) Modified Rankin (Stroke Patients Only) Pre-Morbid Rankin Score: No symptoms Modified Rankin: Moderately severe disability     Balance Overall balance assessment: Needs assistance Sitting-balance support: No upper extremity supported;Feet supported Sitting balance-Leahy Scale: Good Sitting balance - Comments: dons socks sitting EOB   Standing balance support: No upper extremity supported;During functional activity Standing balance-Leahy Scale: Fair Standing balance comment: unable to accept challenge, no AD required                             Pertinent Vitals/Pain Pain Assessment: No/denies pain    Home Living Family/patient expects to be discharged to:: Private residence Living Arrangements: Spouse/significant other Available Help at Discharge: Family Type of Home: House Home Access: Stairs to enter   Technical brewer of Steps: 2 Home Layout: Able to live on main level with bedroom/bathroom;Two level Home Equipment: None Additional Comments: driving , independent    Prior Function Level of Independence: Independent               Hand Dominance  Dominant Hand: Left    Extremity/Trunk Assessment   Upper Extremity Assessment Upper Extremity Assessment: Defer to OT evaluation    Lower Extremity Assessment Lower Extremity Assessment:  Generalized weakness (Strength symmetric, able to perform ankle pumps, LAQ, seated marches)    Cervical / Trunk Assessment Cervical / Trunk Assessment: Other exceptions Cervical / Trunk Exceptions: abdominal obesity  Communication   Communication: No difficulties  Cognition Arousal/Alertness: Awake/alert Behavior During Therapy: WFL for tasks assessed/performed;Impulsive Overall Cognitive Status: Impaired/Different from baseline Area of Impairment: Following commands;Safety/judgement;Problem solving                       Following Commands: Follows one step commands with increased time Safety/Judgement: Decreased awareness of safety   Problem Solving: Difficulty sequencing;Requires verbal cues;Requires tactile cues General Comments: Pt at times moves quickly, is used to be independent with mobility. Pt follows commands with increased time, presents with halting speech and word-finding difficulty at times which is discouraging to pt who states he is typically witty and quick.      General Comments General comments (skin integrity, edema, etc.): SpO63min 85% on RA during gait    Exercises     Assessment/Plan    PT Assessment Patient needs continued PT services  PT Problem List Decreased strength;Decreased mobility;Decreased safety awareness;Decreased activity tolerance;Decreased cognition;Decreased balance;Cardiopulmonary status limiting activity;Obesity       PT Treatment Interventions DME instruction;Gait training;Therapeutic exercise;Patient/family education;Therapeutic activities;Balance training;Stair training;Functional mobility training;Neuromuscular re-education    PT Goals (Current goals can be found in the Care Plan section)  Acute Rehab PT Goals Patient Stated Goal: go home, get back to normal PT Goal Formulation: With patient Time For Goal Achievement: 10/21/20 Potential to Achieve Goals: Good    Frequency Min 4X/week   Barriers to discharge         Co-evaluation PT/OT/SLP Co-Evaluation/Treatment: Yes Reason for Co-Treatment: For patient/therapist safety;To address functional/ADL transfers           AM-PAC PT "6 Clicks" Mobility  Outcome Measure Help needed turning from your back to your side while in a flat bed without using bedrails?: A Little Help needed moving from lying on your back to sitting on the side of a flat bed without using bedrails?: A Little Help needed moving to and from a bed to a chair (including a wheelchair)?: A Little Help needed standing up from a chair using your arms (e.g., wheelchair or bedside chair)?: A Little Help needed to walk in hospital room?: A Little Help needed climbing 3-5 steps with a railing? : A Little 6 Click Score: 18    End of Session Equipment Utilized During Treatment: Oxygen Activity Tolerance: Patient limited by fatigue;Patient tolerated treatment well Patient left: in chair;with nursing/sitter in room;Other (comment) (in transport chair, going to MRI) Nurse Communication: Mobility status PT Visit Diagnosis: Other abnormalities of gait and mobility (R26.89);Difficulty in walking, not elsewhere classified (R26.2)    Time: 5397-6734 PT Time Calculation (min) (ACUTE ONLY): 25 min   Charges:   PT Evaluation $PT Eval Low Complexity: 1 Low          Marlisa Caridi S, PT Acute Rehabilitation Services Pager [phone]  Office [phone]   Roxine Caddy E Ruffin Pyo 10/07/2020, 3:52 PM

## 2020-10-07 NOTE — Progress Notes (Signed)
IP NOTE   Patient had history of covid illness, at end of January, did not requiring hospitalization at that time. Now presents with stroke. Admit PCR testing was positive (which is not surprising, CT value of 38.3 , which is in the non-infectious range) -- represents residual viral particle detected.  Patient does not need Airborne/Contact Isolation for management of current illness.  If questions, please call.  Elzie Rings [place] [facility] [phone]

## 2020-10-07 NOTE — Progress Notes (Signed)
Appreciate Dr. Storm Frisk input   Will discontinue isolation

## 2020-10-07 NOTE — Progress Notes (Signed)
  Echocardiogram 2D Echocardiogram has been performed.  Ryan Combs 10/07/2020, 10:46 AM

## 2020-10-07 NOTE — Evaluation (Signed)
Occupational Therapy Evaluation Patient Details Name: Ryan Combs MRN: [id number] DOB: [birth year] Today's Date: 10/07/2020    History of Present Illness 70 yo male admitted to ED on 2/15 from [facility] for sudden onset of confusion and weakness. 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 The non dominant right vertebral artery is diminutive throughout its course and poorly visualized intradurally. TPA administered 2/15 at 1633. Also Covid-19 positive. PMH includes HTN, angioedema, COPD, anxiety, cataract, BPH with LUTS, allergic rhinitis, and tobacco abuse.   Clinical Impression   PT admitted with R inattention and cognitive changes. Pt currently with functional limitiations due to the deficits listed below (see OT problem list). Pt impulsive and lacks awareness to deficits. Pt states "this has been the most humbling experience I have ever had" Pt seems concerned when informed oxygen dropped 85% on RA with transfers. Pt wanting wife informed of all medical care.  Pt will benefit from skilled OT to increase their independence and safety with adls and balance to allow discharge Outpatient / [facility].     Follow Up Recommendations  Outpatient OT    Equipment Recommendations  None recommended by OT    Recommendations for Other Services Speech consult     Precautions / Restrictions Precautions Precautions: Fall Restrictions Weight Bearing Restrictions: No      Mobility Bed Mobility Overal bed mobility: Needs Assistance Bed Mobility: Supine to Sit     Supine to sit: HOB elevated;Min assist     General bed mobility comments: min assist for trunk elevation, scooting to EOB. Pt performed in very step-wise fashion.    Transfers Overall transfer level: Needs assistance Equipment used: 1 person hand held assist Transfers: Sit to/from Stand Sit to Stand: Min assist;From elevated  surface         General transfer comment: min assist to steady upon standing, STS X2 from EOB and recliner    Balance Overall balance assessment: Needs assistance Sitting-balance support: No upper extremity supported;Feet supported Sitting balance-Leahy Scale: Good Sitting balance - Comments: dons socks sitting EOB   Standing balance support: No upper extremity supported;During functional activity Standing balance-Leahy Scale: Fair Standing balance comment: unable to accept challenge, no AD required                           ADL either performed or assessed with clinical judgement   ADL Overall ADL's : Needs assistance/impaired Eating/Feeding: Modified independent Eating/Feeding Details (indicate cue type and reason): drink soda in a cup with straw                 Lower Body Dressing: Min guard;Sitting/lateral leans Lower Body Dressing Details (indicate cue type and reason): pt sitting eob with hip hick to don socks with extended effort and SOB due to hip flexion Toilet Transfer: Min guard           Functional mobility during ADLs: Min guard General ADL Comments: pt impulsive and needs cues for safety     Vision Baseline Vision/History: No visual deficits Additional Comments: pt able to visually scan and locate room. pt reports vision appears baseline. pt reaching for objects with accuracy     Perception     Praxis      Pertinent Vitals/Pain Pain Assessment: No/denies pain     Hand Dominance Left   Extremity/Trunk Assessment Upper Extremity Assessment Upper Extremity Assessment: Generalized  weakness   Lower Extremity Assessment Lower Extremity Assessment: Defer to PT evaluation   Cervical / Trunk Assessment Cervical / Trunk Assessment: Other exceptions Cervical / Trunk Exceptions: abdominal obesity   Communication Communication Communication: No difficulties   Cognition Arousal/Alertness: Awake/alert Behavior During Therapy: WFL for  tasks assessed/performed;Impulsive Overall Cognitive Status: Impaired/Different from baseline Area of Impairment: Following commands;Safety/judgement;Problem solving                       Following Commands: Follows one step commands with increased time Safety/Judgement: Decreased awareness of safety   Problem Solving: Difficulty sequencing;Requires verbal cues;Requires tactile cues General Comments: Pt at times moves quickly, is used to be independent with mobility. Pt follows commands with increased time, presents with halting speech and word-finding difficulty at times which is discouraging to pt who states he is typically witty and quick.   General Comments  SpO19min 85% on RA during gait    Exercises     Shoulder Instructions      Home Living Family/patient expects to be discharged to:: Private residence Living Arrangements: Spouse/significant other Available Help at Discharge: Family Type of Home: House Home Access: Stairs to enter [employer] of Steps: 2   Home Layout: Able to live on main level with bedroom/bathroom;Two level     Bathroom Shower/Tub: Occupational psychologist: Handicapped height     Home Equipment: None   Additional Comments: driving , independent works as Emergency planning/management officer      Prior Functioning/Environment Level of Independence: Independent                 OT Problem List: Decreased strength;Decreased activity tolerance;Impaired balance (sitting and/or standing);Decreased cognition;Decreased safety awareness;Decreased knowledge of use of DME or AE;Decreased knowledge of precautions;Obesity      OT Treatment/Interventions: Self-care/ADL training;Therapeutic exercise;Neuromuscular education;Energy conservation;DME and/or AE instruction;Manual therapy;Modalities;Therapeutic activities;Cognitive remediation/compensation;Patient/family education;Balance training    OT Goals(Current goals can be found in the care plan  section) Acute Rehab OT Goals Patient Stated Goal: go home, get back to normal OT Goal Formulation: With patient Time For Goal Achievement: 10/21/20 Potential to Achieve Goals: Good  OT Frequency: Min 2X/week   Barriers to D/C:    reports active smoker       Co-evaluation   Reason for Co-Treatment: For patient/therapist safety;To address functional/ADL transfers          AM-PAC OT "6 Clicks" Daily Activity     Outcome Measure Help from another person eating meals?: A Little Help from another person taking care of personal grooming?: A Little Help from another person toileting, which includes using toliet, bedpan, or urinal?: A Little Help from another person bathing (including washing, rinsing, drying)?: A Little Help from another person to put on and taking off regular upper body clothing?: A Little Help from another person to put on and taking off regular lower body clothing?: A Little 6 Click Score: 18   End of Session Nurse Communication: Mobility status;Precautions  Activity Tolerance: Patient tolerated treatment well Patient left: in chair;Other (comment) (transport arriving and requires O2)  OT Visit Diagnosis: Unsteadiness on feet (R26.81);Muscle weakness (generalized) (M62.81)                Time: 1601-0932 OT Time Calculation (min): 27 min Charges:  OT General Charges $OT Visit: 1 Visit OT Evaluation $OT Eval Moderate Complexity: 1 Mod   Brynn, OTR/L  Acute Rehabilitation Services Pager: [phone] Office: [phone] .   [name] 10/07/2020, 4:07  PM

## 2020-10-07 NOTE — Progress Notes (Signed)
OT Cancellation Note  Patient Details Name: Ryan Combs MRN: [id number] DOB: 09-Feb-1951   Cancelled Treatment:    Reason Eval/Treat Not Completed: Active bedrest order OT order received and appreciated however this conflicts with current bedrest order set. Please increase activity tolerance as appropriate and remove bedrest from orders. . Please contact OT at [phone] if bed rest order is discontinued. OT will hold evaluation at this time and will check back as time allows pending increased activity orders.   Fleeta Emmer, OTR/L  Acute Rehabilitation Services Pager: [phone] Office: [phone] .  [name] 10/07/2020, 8:20 AM

## 2020-10-07 NOTE — Consult Note (Signed)
   NAME:  Ryan Combs, MRN:  [id number], DOB:  October 28, 1950, LOS: 1 ADMISSION DATE:  10/06/2020, CONSULTATION DATE:  10/07/2020 REFERRING MD:  Dr Leonie Man, CHIEF COMPLAINT: Covid positive  Brief History:  Patient admitted as a code stroke, did receive TPA with improvement in clinical status  Consult was called for a positive Covid test  History of Present Illness:  He had been sick late January, saw his primary doctor Was treated for a COPD exacerbation at the time Recovered in 3 to 4 days Was not having any respiratory complaints prior to event on the day of presentation on 2/15 Heavy smoker 50-pack-year smoking history, still actively smokes  Past Medical History:   Past Medical History:  Diagnosis Date  . Angio-edema   . High blood pressure   . Tobacco abuse    Significant Hospital Events:  Code stroke-s/p TPA  Consults:  pccm  Procedures:    Significant Diagnostic Tests:  CT head 2/15 IMPRESSION: No acute intracranial hemorrhage. No acute infarction, noting that inferior slices are significantly degraded by motion artifact. Micro Data:    Antimicrobials:     Interim History / Subjective:  Awake alert interactive Denies any shortness of breath at present  Objective   Blood pressure 138/61, pulse 67, temperature 97.9 F (36.6 C), temperature source Oral, resp. rate (!) 21, height 5' 9.5" (1.765 m), weight 123.5 kg, SpO2 93 %.        Intake/Output Summary (Last 24 hours) at 10/07/2020 1007 Last data filed at 10/07/2020 0600 Gross per 24 hour  Intake 987.95 ml  Output 500 ml  Net 487.95 ml   Filed Weights   10/06/20 1500 10/06/20 1627  Weight: 123.5 kg 123.5 kg    Examination: General: Elderly gentleman, does not appear to be in distress, appropriate in conversation HENT: Moist oral mucosa Lungs: Decreased air movement at the bases bilaterally Cardiovascular: S1-S2 appreciated Abdomen: Soft, bowel sounds appreciated Extremities: No clubbing, no  edema Neuro: Alert and oriented x3 GU:   Resolved Hospital Problem list     Assessment & Plan:  Incidental Covid positivity -Patient may have had Covid when he was sick late January -Was treated for respiratory infection at the time and did recover after 3 to 4 days -Himself and his spouse was sick at the time, they were vaccinated so did not get any testing done at the time -He does not have any symptoms at present to suggest that he has an active infection at present -I do not believe he needs any treatment for COVID  He likely has underlying chronic obstructive pulmonary disease with his significant more than 50-pack-year smoking history -He states that he uses an inhaler periodically at home but I could not find one on his list of medications -No PFT on record -Not wheezing or actively short of breath at present -Bronchodilators as needed  He stated he uses a CPAP at home -No documentation of a sleep study in his record  No medication treatment recommended for positive test Vaccinated, asymptomatic May still need 5 days of isolation Unclear whether the URI he had in January was when he actually had Covid infection   Did speak with his wife over the phone who confirmed the illness end of January  Discussed with Dr. Renne Musca, MD [place] Pager: [phone]

## 2020-10-07 NOTE — Progress Notes (Addendum)
STROKE TEAM PROGRESS NOTE   INTERVAL HISTORY  70 yo male with a PMHx of HTN, angioedema, COPD, anxiety, cataract, BPH with LUTS, allergic rhinitis, and tobacco abuse who presents with confusion .  On 10/06/2020 at approximately 1445 he became acutely confused at work.  EMS was called and he was taken to the ED as a code stroke.  NIHSS was a 5 on admission (confusion, bilateral lower extremity weakness, and disarthria).  CT head showed no acute intracranial hemorrhage or infarction.  TpA was given at 1633.  CTA head showed no evidence of LVO.      Afebrile. Vital signs stable.  On 2 L of O2.  Incidental Covid positivity. Neurological exam: awake/alert and oriented x3, PERRL, EOMI, FC x4, 5/5 strength in the upper extremities, 4/5 strength bilateral lower extremities.  Blood pressure adequately controlled.  His COVID was at the end of January and infectious disease feel is noninfectious at the present time and Covid precautions can be lifted  Vitals:   10/07/20 0800 10/07/20 1035 10/07/20 1240 10/07/20 1300  BP: (!) 148/67 133/68  (!) 149/73  Pulse: 68 65 68 72  Resp: 17 15 18 18   Temp: 97.8 F (36.6 C) (!) 97.3 F (36.3 C)    TempSrc: Oral     SpO2: 92% 93% 90% 93%  Weight:      Height:       CBC:  Recent Labs  Lab 10/06/20 1600  WBC 11.2*  NEUTROABS 7.2  HGB 14.5  14.6  HCT 45.4  43.0  MCV 94.6  PLT 937   Basic Metabolic Panel:  Recent Labs  Lab 10/06/20 1600  NA 140  141  K 3.6  3.6  CL 103  104  CO2 27  GLUCOSE 130*  128*  BUN 11  13  CREATININE 0.88  0.80  CALCIUM 8.8*   **Lipid Panel:  Recent Labs  Lab 10/07/20 0019  CHOL 164  TRIG 77  HDL 36*  CHOLHDL 4.6  VLDL 15  LDLCALC 113*   **HgbA1c: No results for input(s): HGBA1C in the last 168 hours. Urine Drug Screen:  Recent Labs  Lab 10/06/20 1854  LABOPIA NONE DETECTED  COCAINSCRNUR NONE DETECTED  LABBENZ POSITIVE*  AMPHETMU NONE DETECTED  THCU POSITIVE*  LABBARB NONE DETECTED    Alcohol  Level  Recent Labs  Lab 10/07/20 0232  ETH <10    IMAGING past 24 hours CT HEAD CODE STROKE WO CONTRAST  Result Date: 10/06/2020 CLINICAL DATA:  Code stroke. EXAM: CT HEAD WITHOUT CONTRAST TECHNIQUE: Contiguous axial images were obtained from the base of the skull through the vertex without intravenous contrast. COMPARISON:  None. FINDINGS: Motion artifact is present despite repeat imaging. Lower slices are particularly degraded. Brain: There is no acute intracranial hemorrhage, mass effect, or edema. No loss of gray-white differentiation. Ventricles and sulci are within normal limits in size and configuration. Vascular: No definite hyperdense vessel. Skull: Unremarkable. Sinuses/Orbits: Poorly evaluated. Other: Poorly evaluated. IMPRESSION: No acute intracranial hemorrhage. No acute infarction, noting that inferior slices are significantly degraded by motion artifact. These results were communicated to Dr. Charlsie Merles at 4:14 pm on 10/06/2020 by text page via the [facility] messaging system. Electronically Signed   By: Macy Mis M.D.   On: 10/06/2020 16:21   ECHOCARDIOGRAM LIMITED  Result Date: 10/07/2020    ECHOCARDIOGRAM LIMITED REPORT   Patient Name:   Ryan Combs Date of Exam: 10/07/2020 Medical Rec #:  [id number]  Height:       69.5 in Accession #:    [id number]       Weight:       272.3 lb Date of Birth:  19-Feb-1951        BSA:          2.367 m Patient Age:    17 years         BP:           138/61 mmHg Patient Gender: M                HR:           67 bpm. Exam Location:  Inpatient Procedure: Limited Echo, Cardiac Doppler and Color Doppler Indications:    CVA  History:        Patient has no prior history of Echocardiogram examinations.                 COPD; Risk Factors:Hypertension, Current Smoker and Obesity.  Sonographer:    Dustin Flock Referring Phys: [phone] Gardiner Barefoot  Sonographer Comments: COVID+ IMPRESSIONS  1. Left ventricular ejection fraction, by estimation, is 60 to  65%. The left ventricle has normal function. The left ventricle has no regional wall motion abnormalities. The left ventricular internal cavity size was mildly dilated. There is mild left ventricular hypertrophy.  2. Right ventricular systolic function is normal. The right ventricular size is normal.  3. The mitral valve is normal in structure. No evidence of mitral valve regurgitation.  4. The aortic valve was not well visualized. Aortic valve regurgitation is not visualized. No aortic stenosis is present. FINDINGS  Left Ventricle: Left ventricular ejection fraction, by estimation, is 60 to 65%. The left ventricle has normal function. The left ventricle has no regional wall motion abnormalities. The left ventricular internal cavity size was mildly dilated. There is  mild left ventricular hypertrophy. Right Ventricle: The right ventricular size is normal. Right vetricular wall thickness was not assessed. Right ventricular systolic function is normal. Pericardium: Trivial pericardial effusion is present. Mitral Valve: The mitral valve is normal in structure. Aortic Valve: The aortic valve was not well visualized. Aortic valve regurgitation is not visualized. No aortic stenosis is present. Aorta: The aortic root is normal in size and structure. IAS/Shunts: The interatrial septum was not well visualized. LEFT VENTRICLE PLAX 2D LVIDd:         6.20 cm  Diastology LVIDs:         4.60 cm  LV e' medial:    9.25 cm/s LV PW:         1.40 cm  LV E/e' medial:  11.7 LV IVS:        1.30 cm  LV e' lateral:   8.16 cm/s LVOT diam:     2.10 cm  LV E/e' lateral: 13.2 LVOT Area:     3.46 cm  RIGHT VENTRICLE RV S prime:     10.20 cm/s LEFT ATRIUM         Index LA diam:    4.40 cm 1.86 cm/m   AORTA Ao Root diam: 3.10 cm MITRAL VALVE                TRICUSPID VALVE MV Area (PHT): 2.66 cm     TR Peak grad:   16.2 mmHg MV Decel Time: 285 msec     TR Vmax:        201.00 cm/s MV E velocity: 108.00 cm/s MV A velocity: 84.00 cm/s  SHUNTS MV  E/A ratio:  1.29         Systemic Diam: 2.10 cm Oswaldo Milian MD Electronically signed by Oswaldo Milian MD Signature Date/Time: 10/07/2020/1:00:50 PM    Final    CT ANGIO HEAD CODE STROKE  Result Date: 10/06/2020 CLINICAL DATA:  Neuro deficit, acute stroke suspected. Dizziness. Altered mental status. EXAM: CT ANGIOGRAPHY HEAD AND NECK TECHNIQUE: Multidetector CT imaging of the head and neck was performed using the standard protocol during bolus administration of intravenous contrast. Multiplanar CT image reconstructions and MIPs were obtained to evaluate the vascular anatomy. Carotid stenosis measurements (when applicable) are obtained utilizing NASCET criteria, using the distal internal carotid diameter as the denominator. CONTRAST:  19mL OMNIPAQUE IOHEXOL 350 MG/ML SOLN COMPARISON:  Same day CT head code stroke. FINDINGS: CTA NECK FINDINGS Aortic arch: Atherosclerosis.  Great vessel origins are patent. Right carotid system: Mixed calcific and noncalcific atherosclerosis at the bifurcation without evidence of greater than 50% stenosis. No evidence of dissection. Left carotid system: Mixed calcific and noncalcific atherosclerosis at the bifurcation without evidence of greater than 50% stenosis. No evidence of dissection. Vertebral arteries: Left dominant. At C6-C7 on the left there is focal approximately 60% narrowing of the left vertebral artery. The remainder of the left vertebral artery is patent. The right vertebral artery is diminutive throughout its course and not well visualized intradurally. Skeleton: Mild-to-moderate multilevel degenerative disc disease and facet arthropathy. No acute abnormality. Other neck: Bilateral parotid masses, measuring up to 1.6 cm on the left and 1.3 cm on the right. Upper chest: Emphysema. Lung apices are clear. Bronchial wall thickening. Review of the MIP images confirms the above findings CTA HEAD FINDINGS Anterior circulation: No large vessel occlusion,  proximal hemodynamically significant stenosis, or aneurysm. 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 Posterior circulation: Left dominant vertebral artery is patent intradurally. Diminutive right vertebral artery is poorly visualized intradurally. Basilar arteries patent with mild stenosis distally by lateral fetal type PCAs with small P1 PCAs bilaterally. No evidence of significant proximal PCA stenosis. Evaluation distally is limited by venous contamination. Dysplastic appearance of the distal basilar artery without discrete aneurysm. Venous sinuses: As permitted by contrast timing, patent. Anatomic variants: Bilateral fetal type PCAs. Hypoplastic left A1 ACA with large right A1 ACA. Review of the MIP images confirms the above findings IMPRESSION: 1. Intracranially, no evidence of emergent large vessel occlusion or proximal hemodynamically significant stenosis. 2. 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 The non dominant right vertebral artery is diminutive throughout its course and poorly visualized intradurally. 3. Nonspecific bilateral parotid masses. Given that these could be benign or malignant, recommend nonurgent ENT consultation for management. 4. Emphysema. Electronically Signed   By: Margaretha Sheffield MD   On: 10/06/2020 16:56   CT ANGIO NECK CODE STROKE  Result Date: 10/06/2020 CLINICAL DATA:  Neuro deficit, acute stroke suspected. Dizziness. Altered mental status. EXAM: CT ANGIOGRAPHY HEAD AND NECK TECHNIQUE: Multidetector CT imaging of the head and neck was performed using the standard protocol during bolus administration of intravenous contrast. Multiplanar CT image reconstructions and MIPs were obtained to evaluate the vascular anatomy. Carotid stenosis measurements (when applicable) are obtained utilizing NASCET criteria, using the distal internal carotid diameter as the denominator. CONTRAST:  41mL OMNIPAQUE  IOHEXOL 350 MG/ML SOLN COMPARISON:  Same day CT head code stroke. FINDINGS: CTA NECK FINDINGS Aortic arch: Atherosclerosis.  Great vessel origins are patent. Right carotid system:  Mixed calcific and noncalcific atherosclerosis at the bifurcation without evidence of greater than 50% stenosis. No evidence of dissection. Left carotid system: Mixed calcific and noncalcific atherosclerosis at the bifurcation without evidence of greater than 50% stenosis. No evidence of dissection. Vertebral arteries: Left dominant. At C6-C7 on the left there is focal approximately 60% narrowing of the left vertebral artery. The remainder of the left vertebral artery is patent. The right vertebral artery is diminutive throughout its course and not well visualized intradurally. Skeleton: Mild-to-moderate multilevel degenerative disc disease and facet arthropathy. No acute abnormality. Other neck: Bilateral parotid masses, measuring up to 1.6 cm on the left and 1.3 cm on the right. Upper chest: Emphysema. Lung apices are clear. Bronchial wall thickening. Review of the MIP images confirms the above findings CTA HEAD FINDINGS Anterior circulation: No large vessel occlusion, proximal hemodynamically significant stenosis, or aneurysm. 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 Posterior circulation: Left dominant vertebral artery is patent intradurally. Diminutive right vertebral artery is poorly visualized intradurally. Basilar arteries patent with mild stenosis distally by lateral fetal type PCAs with small P1 PCAs bilaterally. No evidence of significant proximal PCA stenosis. Evaluation distally is limited by venous contamination. Dysplastic appearance of the distal basilar artery without discrete aneurysm. Venous sinuses: As permitted by contrast timing, patent. Anatomic variants: Bilateral fetal type PCAs. Hypoplastic left A1 ACA with large right A1 ACA. Review of the MIP images confirms the above findings  IMPRESSION: 1. Intracranially, no evidence of emergent large vessel occlusion or proximal hemodynamically significant stenosis. 2. 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 The non dominant right vertebral artery is diminutive throughout its course and poorly visualized intradurally. 3. Nonspecific bilateral parotid masses. Given that these could be benign or malignant, recommend nonurgent ENT consultation for management. 4. Emphysema. Electronically Signed   By: Margaretha Sheffield MD   On: 10/06/2020 16:56    PHYSICAL EXAM  Mental Status: Patient is awake, alert to person, place, and time.    Able to follow complex commands speech is clear without dysarthria or aphasia.  Slight diminished attention, registration and recall.  Diminished recall Cranial Nerves: II: Visual Fields arefull Pupils are equal, round, and reactive to light. III,IV, VI: EOMI. Eyelids elevate symmetrically.  V: Facial sensation is symmetric to temperature VII: Facial movement is symmetric.  VIII: hearing is intact to voice X: Uvula elevates symmetrically XI: Shoulder shrug is symmetric. XII: tongue is midline without atrophy or fasciculations.  Motor: Tone is normal. Bulk is normal. 5/5 strength in upper extremities. 4/5 strength in lower extremities  Sensory: Sensation is symmetric to light touch and temperature in the arms and legs. Coordination: FTN intact bilaterally as well as HKS. No ataxia  ASSESSMENT/PLAN [name] is a 70 y.o. male with history of hypertension, COPD, tobacco abuse  presenting with confusion.    Stroke: Confusion and left-sided weakness treated with IV TPA   code Stroke CT head No acute abnormality.   CTA head & neck: no evidence of emergent large vessel occlusion. 60% stenosis of the left dominant vertebral  artery at the level of C6-C7   MRI  pending  2D Echo: EF 60-65%, mild LVH  LDL 113  HgbA1c Pending  VTE  prophylaxis - holding for now until MRI brain is complete an no evidence of hemorrhage or enlarged infarct  Diet: heart health  No antithrombotic prior to admission, patient has an allergy to  aspirin (anaphylaxis), will start Plavix 75mg  after no evidence of Hemorrhage seen on MRI  Therapy recommendations:  pending  Disposition:  pendign  Hypertension  Home meds:  Amlodipine 5mg  daily  Stable  BP goal <180/105 for 24 hours after TpA then allow permissive hypertension (OK if < 220/120) but gradually normalize in 5-7 days . Long-term BP goal normotensive  Hyperlipidemia  Home meds: none  LDL 113, goal < 70  Add atorvastatin 80mg  daily  Continue statin at discharge  Other Stroke Risk Factors  Advanced Age >/= 32   Cigarette smoker advised to stop smoking  Substance abuse - UDS:  THC POSITIVE, Cocaine NONE DETECTED. Patient advised to stop using due to stroke risk.  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 Unclear if benign or malignant, recommend nonurgent ENT consultation for management as an outpatient.  Incidental Covid Positivity:  Consulted critical care medicine.  Patient was sick in late January.  Treated for respiratory infection.  Patient did not test for Covid.  He was vaccinated with JJ in August  Hospital day # 1 I have personally obtained history,examined this patient, reviewed notes, independently viewed imaging studies, participated in medical decision making and plan of care.ROS completed by me personally and pertinent positives fully documented  I have made any additions or clarifications directly to the above note. Agree with note above.  Patient presented with confusion and hallucinations and questionable left-sided weakness and was given IV  TPA seems to have improved.  Recommend close neurological follow-up and strict blood pressure control as per post TPA protocol.  Check MRI scan of the brain later today.  Discontinue Covid precautions as per ID recommendations.  From discussion with the patient at the bedside as well as with his wife, spoke to over video call and answered questions.  Continue ongoing stroke work-up.  Patient counseled to quit smoking cigarettes and marijuana and is agreeable.This patient is critically ill and at significant risk of neurological worsening, death and care requires constant monitoring of vital signs, hemodynamics,respiratory and cardiac monitoring, extensive review of multiple databases, frequent neurological assessment, discussion with family, other specialists and medical decision making of high complexity.I have made any additions or clarifications directly to the above note.This critical care time does not reflect procedure time, or teaching time or supervisory time of PA/NP/Med Resident etc but could involve care discussion time.  I spent 30 minutes of neurocritical care time  in the care of  this patient.      Antony Contras, MD Medical Director [facility] Pager: [phone] 10/07/2020 2:44 PM   To contact Stroke Continuity provider, please refer to [email or web address] After hours, contact General Neurology

## 2020-10-08 DIAGNOSIS — R2990 Unspecified symptoms and signs involving the nervous system: Secondary | ICD-10-CM

## 2020-10-08 LAB — HEMOGLOBIN A1C
Hgb A1c MFr Bld: 6.4 % — ABNORMAL HIGH (ref 4.8–5.6)
Mean Plasma Glucose: 137 mg/dL

## 2020-10-08 MED ORDER — PANTOPRAZOLE SODIUM 40 MG PO TBEC: 40.0000 mg | DELAYED_RELEASE_TABLET | Freq: Every day | ORAL | Status: DC

## 2020-10-08 MED ORDER — ATORVASTATIN CALCIUM 80 MG PO TABS: 80.0000 mg | ORAL_TABLET | Freq: Every day | ORAL | 1 refills | Status: DC

## 2020-10-08 MED ORDER — CLOPIDOGREL BISULFATE 75 MG PO TABS: 75.0000 mg | ORAL_TABLET | Freq: Every day | ORAL | 1 refills | Status: AC

## 2020-10-08 NOTE — Discharge Summary (Addendum)
Stroke Discharge Summary  Patient ID: Ryan Combs   MRN: [id number]      DOB: [birth year]  Date of Admission: 10/06/2020 Date of Discharge: 10/08/2020  Attending Physician:  Dr. Fernande Bras Consultant(s):    [place], Dr. Cicero Duck Patient's PCP:  Enid Skeens., MD  DISCHARGE DIAGNOSIS:  1. Stroke Like Episode consisting of confusion, dysarthria and bilat LE weakness treated with iv tPA with resolution of symptoms.  2. Incidental Covid Positivity without any signs of active Covid infection  Allergies as of 10/08/2020       Reactions   Aspirin Anaphylaxis        Medication List     TAKE these medications    albuterol 108 (90 Base) MCG/ACT inhaler Commonly known as: VENTOLIN HFA Inhale 1-2 puffs into the lungs every 6 (six) hours as needed for wheezing or shortness of breath.   amLODipine 5 MG tablet Commonly known as: NORVASC Take 5 mg by mouth daily.   atorvastatin 80 MG tablet Commonly known as: LIPITOR Take 1 tablet (80 mg total) by mouth daily. Start taking on: October 09, 2020   clonazePAM 1 MG tablet Commonly known as: KLONOPIN Take 1 mg by mouth 3 (three) times daily as needed for anxiety.   clopidogrel 75 MG tablet Commonly known as: Plavix Take 1 tablet (75 mg total) by mouth daily.   EPINEPHrine 0.3 mg/0.3 mL Soaj injection Commonly known as: EPI-PEN Use as directed for life threatening allergic reactions only What changed:  how much to take how to take this when to take this reasons to take this additional instructions   loratadine 10 MG tablet Commonly known as: CLARITIN Take 10 mg by mouth daily.   sildenafil 100 MG tablet Commonly known as: VIAGRA Take 100 mg by mouth daily as needed for erectile dysfunction.       LABORATORY STUDIES CBC    Component Value Date/Time   WBC 11.2 (H) 10/06/2020 1600   RBC 4.80 10/06/2020 1600   HGB 14.5 10/06/2020 1600   HGB 14.6 10/06/2020 1600   HGB 13.4 09/24/2018 1425    HCT 45.4 10/06/2020 1600   HCT 43.0 10/06/2020 1600   HCT 41.3 09/24/2018 1425   PLT 225 10/06/2020 1600   PLT 303 09/24/2018 1425   MCV 94.6 10/06/2020 1600   MCV 89 09/24/2018 1425   MCH 30.2 10/06/2020 1600   MCHC 31.9 10/06/2020 1600   RDW 13.9 10/06/2020 1600   RDW 13.3 09/24/2018 1425   LYMPHSABS 2.5 10/06/2020 1600   LYMPHSABS 1.8 09/24/2018 1425   MONOABS 1.1 (H) 10/06/2020 1600   EOSABS 0.2 10/06/2020 1600   EOSABS 0.3 09/24/2018 1425   BASOSABS 0.1 10/06/2020 1600   BASOSABS 0.1 09/24/2018 1425   CMP    Component Value Date/Time   NA 140 10/06/2020 1600   NA 141 10/06/2020 1600   K 3.6 10/06/2020 1600   K 3.6 10/06/2020 1600   CL 103 10/06/2020 1600   CL 104 10/06/2020 1600   CO2 27 10/06/2020 1600   GLUCOSE 130 (H) 10/06/2020 1600   GLUCOSE 128 (H) 10/06/2020 1600   BUN 11 10/06/2020 1600   BUN 13 10/06/2020 1600   CREATININE 0.88 10/06/2020 1600   CREATININE 0.80 10/06/2020 1600   CALCIUM 8.8 (L) 10/06/2020 1600   PROT 6.5 10/06/2020 1600   ALBUMIN 3.2 (L) 10/06/2020 1600   AST 17 10/06/2020 1600   ALT 24 10/06/2020 1600   ALKPHOS 78 10/06/2020  1600   BILITOT 0.9 10/06/2020 1600   GFRNONAA >60 10/06/2020 1600   COAGS Lab Results  Component Value Date   INR 1.0 10/06/2020   Lipid Panel    Component Value Date/Time   CHOL 164 10/07/2020 0019   TRIG 77 10/07/2020 0019   HDL 36 (L) 10/07/2020 0019   CHOLHDL 4.6 10/07/2020 0019   VLDL 15 10/07/2020 0019   LDLCALC 113 (H) 10/07/2020 0019   HgbA1C  Lab Results  Component Value Date   HGBA1C 6.4 (H) 10/07/2020   Urinalysis No results found for: COLORURINE, APPEARANCEUR, LABSPEC, PHURINE, GLUCOSEU, HGBUR, BILIRUBINUR, KETONESUR, PROTEINUR, UROBILINOGEN, NITRITE, LEUKOCYTESUR Urine Drug Screen     Component Value Date/Time   LABOPIA NONE DETECTED 10/06/2020 1854   COCAINSCRNUR NONE DETECTED 10/06/2020 1854   LABBENZ POSITIVE (A) 10/06/2020 1854   AMPHETMU NONE DETECTED 10/06/2020 1854    THCU POSITIVE (A) 10/06/2020 1854   LABBARB NONE DETECTED 10/06/2020 1854    Alcohol Level    Component Value Date/Time   ETH <10 10/07/2020 0232    SIGNIFICANT DIAGNOSTIC STUDIES MR BRAIN WO CONTRAST  Result Date: 10/07/2020 CLINICAL DATA:  Follow-up stroke EXAM: MRI HEAD WITHOUT CONTRAST TECHNIQUE: Multiplanar, multiecho pulse sequences of the brain and surrounding structures were obtained without intravenous contrast. COMPARISON:  CT studies done yesterday. FINDINGS: Brain: Diffusion imaging does not show any acute or subacute infarction. No focal abnormality affects the brainstem or cerebellum. Cerebral hemispheres are likewise normal, remarkable at this age. No atrophy, small or large vessel infarction, mass lesion, hemorrhage, hydrocephalus or extra-axial collection. Vascular: Major vessels at the base of the brain show flow. Skull and upper cervical spine: Negative Sinuses/Orbits: Mild seasonal mucosal thickening of the sinuses. Normal orbits, with bilateral lens implants. Other: None IMPRESSION: Normal examination, remarkable at this age. No evidence of atrophy or any old small or large vessel infarction. No abnormality seen to explain the presenting symptoms. Electronically Signed   By: Nelson Chimes M.D.   On: 10/07/2020 15:42   CT HEAD CODE STROKE WO CONTRAST  Result Date: 10/06/2020 CLINICAL DATA:  Code stroke. EXAM: CT HEAD WITHOUT CONTRAST TECHNIQUE: Contiguous axial images were obtained from the base of the skull through the vertex without intravenous contrast. COMPARISON:  None. FINDINGS: Motion artifact is present despite repeat imaging. Lower slices are particularly degraded. Brain: There is no acute intracranial hemorrhage, mass effect, or edema. No loss of gray-white differentiation. Ventricles and sulci are within normal limits in size and configuration. Vascular: No definite hyperdense vessel. Skull: Unremarkable. Sinuses/Orbits: Poorly evaluated. Other: Poorly evaluated.  IMPRESSION: No acute intracranial hemorrhage. No acute infarction, noting that inferior slices are significantly degraded by motion artifact. These results were communicated to Dr. Charlsie Merles at 4:14 pm on 10/06/2020 by text page via the [facility] messaging system. Electronically Signed   By: Macy Mis M.D.   On: 10/06/2020 16:21   ECHOCARDIOGRAM LIMITED  Result Date: 10/07/2020    ECHOCARDIOGRAM LIMITED REPORT   Patient Name:   Ryan Combs Date of Exam: 10/07/2020 Medical Rec #:  [id number]        Height:       69.5 in Accession #:    [id number]       Weight:       272.3 lb Date of Birth:  Oct 04, 1950        BSA:          2.367 m Patient Age:    70 years  BP:           138/61 mmHg Patient Gender: M                HR:           67 bpm. Exam Location:  Inpatient Procedure: Limited Echo, Cardiac Doppler and Color Doppler Indications:    CVA  History:        Patient has no prior history of Echocardiogram examinations.                 COPD; Risk Factors:Hypertension, Current Smoker and Obesity.  Sonographer:    Dustin Flock Referring Phys: [phone] Gardiner Barefoot  Sonographer Comments: COVID+ IMPRESSIONS  1. Left ventricular ejection fraction, by estimation, is 60 to 65%. The left ventricle has normal function. The left ventricle has no regional wall motion abnormalities. The left ventricular internal cavity size was mildly dilated. There is mild left ventricular hypertrophy.  2. Right ventricular systolic function is normal. The right ventricular size is normal.  3. The mitral valve is normal in structure. No evidence of mitral valve regurgitation.  4. The aortic valve was not well visualized. Aortic valve regurgitation is not visualized. No aortic stenosis is present. FINDINGS  Left Ventricle: Left ventricular ejection fraction, by estimation, is 60 to 65%. The left ventricle has normal function. The left ventricle has no regional wall motion abnormalities. The left ventricular internal cavity size was  mildly dilated. There is  mild left ventricular hypertrophy. Right Ventricle: The right ventricular size is normal. Right vetricular wall thickness was not assessed. Right ventricular systolic function is normal. Pericardium: Trivial pericardial effusion is present. Mitral Valve: The mitral valve is normal in structure. Aortic Valve: The aortic valve was not well visualized. Aortic valve regurgitation is not visualized. No aortic stenosis is present. Aorta: The aortic root is normal in size and structure. IAS/Shunts: The interatrial septum was not well visualized. LEFT VENTRICLE PLAX 2D LVIDd:         6.20 cm  Diastology LVIDs:         4.60 cm  LV e' medial:    9.25 cm/s LV PW:         1.40 cm  LV E/e' medial:  11.7 LV IVS:        1.30 cm  LV e' lateral:   8.16 cm/s LVOT diam:     2.10 cm  LV E/e' lateral: 13.2 LVOT Area:     3.46 cm  RIGHT VENTRICLE RV S prime:     10.20 cm/s LEFT ATRIUM         Index LA diam:    4.40 cm 1.86 cm/m   AORTA Ao Root diam: 3.10 cm MITRAL VALVE                TRICUSPID VALVE MV Area (PHT): 2.66 cm     TR Peak grad:   16.2 mmHg MV Decel Time: 285 msec     TR Vmax:        201.00 cm/s MV E velocity: 108.00 cm/s MV A velocity: 84.00 cm/s   SHUNTS MV E/A ratio:  1.29         Systemic Diam: 2.10 cm Oswaldo Milian MD Electronically signed by Oswaldo Milian MD Signature Date/Time: 10/07/2020/1:00:50 PM    Final    CT ANGIO HEAD CODE STROKE  Result Date: 10/06/2020 CLINICAL DATA:  Neuro deficit, acute stroke suspected. Dizziness. Altered mental status. EXAM: CT ANGIOGRAPHY HEAD AND NECK TECHNIQUE: Multidetector CT  imaging of the head and neck was performed using the standard protocol during bolus administration of intravenous contrast. Multiplanar CT image reconstructions and MIPs were obtained to evaluate the vascular anatomy. Carotid stenosis measurements (when applicable) are obtained utilizing NASCET criteria, using the distal internal carotid diameter as the denominator.  CONTRAST:  3mL OMNIPAQUE IOHEXOL 350 MG/ML SOLN COMPARISON:  Same day CT head code stroke. FINDINGS: CTA NECK FINDINGS Aortic arch: Atherosclerosis.  Great vessel origins are patent. Right carotid system: Mixed calcific and noncalcific atherosclerosis at the bifurcation without evidence of greater than 50% stenosis. No evidence of dissection. Left carotid system: Mixed calcific and noncalcific atherosclerosis at the bifurcation without evidence of greater than 50% stenosis. No evidence of dissection. Vertebral arteries: Left dominant. At C6-C7 on the left there is focal approximately 60% narrowing of the left vertebral artery. The remainder of the left vertebral artery is patent. The right vertebral artery is diminutive throughout its course and not well visualized intradurally. Skeleton: Mild-to-moderate multilevel degenerative disc disease and facet arthropathy. No acute abnormality. Other neck: Bilateral parotid masses, measuring up to 1.6 cm on the left and 1.3 cm on the right. Upper chest: Emphysema. Lung apices are clear. Bronchial wall thickening. Review of the MIP images confirms the above findings CTA HEAD FINDINGS Anterior circulation: No large vessel occlusion, proximal hemodynamically significant stenosis, or aneurysm. 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 Posterior circulation: Left dominant vertebral artery is patent intradurally. Diminutive right vertebral artery is poorly visualized intradurally. Basilar arteries patent with mild stenosis distally by lateral fetal type PCAs with small P1 PCAs bilaterally. No evidence of significant proximal PCA stenosis. Evaluation distally is limited by venous contamination. Dysplastic appearance of the distal basilar artery without discrete aneurysm. Venous sinuses: As permitted by contrast timing, patent. Anatomic variants: Bilateral fetal type PCAs. Hypoplastic left A1 ACA with large right A1 ACA. Review of the MIP images  confirms the above findings IMPRESSION: 1. Intracranially, no evidence of emergent large vessel occlusion or proximal hemodynamically significant stenosis. 2. 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 The non dominant right vertebral artery is diminutive throughout its course and poorly visualized intradurally. 3. Nonspecific bilateral parotid masses. Given that these could be benign or malignant, recommend nonurgent ENT consultation for management. 4. Emphysema. Electronically Signed   By: Margaretha Sheffield MD   On: 10/06/2020 16:56   CT ANGIO NECK CODE STROKE  Result Date: 10/06/2020 CLINICAL DATA:  Neuro deficit, acute stroke suspected. Dizziness. Altered mental status. EXAM: CT ANGIOGRAPHY HEAD AND NECK TECHNIQUE: Multidetector CT imaging of the head and neck was performed using the standard protocol during bolus administration of intravenous contrast. Multiplanar CT image reconstructions and MIPs were obtained to evaluate the vascular anatomy. Carotid stenosis measurements (when applicable) are obtained utilizing NASCET criteria, using the distal internal carotid diameter as the denominator. CONTRAST:  29mL OMNIPAQUE IOHEXOL 350 MG/ML SOLN COMPARISON:  Same day CT head code stroke. FINDINGS: CTA NECK FINDINGS Aortic arch: Atherosclerosis.  Great vessel origins are patent. Right carotid system: Mixed calcific and noncalcific atherosclerosis at the bifurcation without evidence of greater than 50% stenosis. No evidence of dissection. Left carotid system: Mixed calcific and noncalcific atherosclerosis at the bifurcation without evidence of greater than 50% stenosis. No evidence of dissection. Vertebral arteries: Left dominant. At C6-C7 on the left there is focal approximately 60% narrowing of the left vertebral artery. The remainder of the left vertebral artery is  patent. The right vertebral artery is diminutive throughout its course and not well visualized  intradurally. Skeleton: Mild-to-moderate multilevel degenerative disc disease and facet arthropathy. No acute abnormality. Other neck: Bilateral parotid masses, measuring up to 1.6 cm on the left and 1.3 cm on the right. Upper chest: Emphysema. Lung apices are clear. Bronchial wall thickening. Review of the MIP images confirms the above findings CTA HEAD FINDINGS Anterior circulation: No large vessel occlusion, proximal hemodynamically significant stenosis, or aneurysm. 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 Posterior circulation: Left dominant vertebral artery is patent intradurally. Diminutive right vertebral artery is poorly visualized intradurally. Basilar arteries patent with mild stenosis distally by lateral fetal type PCAs with small P1 PCAs bilaterally. No evidence of significant proximal PCA stenosis. Evaluation distally is limited by venous contamination. Dysplastic appearance of the distal basilar artery without discrete aneurysm. Venous sinuses: As permitted by contrast timing, patent. Anatomic variants: Bilateral fetal type PCAs. Hypoplastic left A1 ACA with large right A1 ACA. Review of the MIP images confirms the above findings IMPRESSION: 1. Intracranially, no evidence of emergent large vessel occlusion or proximal hemodynamically significant stenosis. 2. 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 The non dominant right vertebral artery is diminutive throughout its course and poorly visualized intradurally. 3. Nonspecific bilateral parotid masses. Given that these could be benign or malignant, recommend nonurgent ENT consultation for management. 4. Emphysema. Electronically Signed   By: Margaretha Sheffield MD   On: 10/06/2020 16:56   HISTORY OF PRESENT ILLNESS 70 yo male with a PMHx of HTN, angioedema, COPD, anxiety, cataract, BPH with LUTS, allergic rhinitis, and tobacco abuse who presents with confusion .  On  10/06/2020 at approximately 1445 he became acutely confused at work.  EMS was called and he was taken to the ED as a code stroke.  NIHSS was a 5 on admission (confusion, bilateral lower extremity weakness, and disarthria).  CT head showed no acute intracranial hemorrhage or infarction.  TpA was given at 1633.  CTA head showed no evidence of LVO.  He was admitted for further neurologic evaluation and care.   HOSPITAL COURSE  [name] is a 70 y.o. male with history of hypertension, COPD, tobacco abuse  presenting with confusion. S/p tPA with resolution of symptoms.  He was found to have had a stroke like episode consisting of confusion and left-sided weakness treated with IV tPA. Symptoms resolved post tPA.  Neurologic work up included:  Code Stroke CT head No acute abnormality.  CTA head & neck: no evidence of emergent large vessel occlusion. 60% stenosis of the left dominant vertebral artery at the level of C6-C7  MRI   IMPRESSION: Normal examination, remarkable at this age. No evidence of atrophy or any old small or large vessel infarction. No abnormality seen to explain the presenting symptoms. 2D Echo: EF 60-65%, mild LVH LDL 113 HgbA1c 6.4 VTE prophylaxis - holding for now until MRI brain is complete an no evidence of hemorrhage or enlarged infarct Diet: heart health No antithrombotic prior to admission, patient has an allergy to aspirin (anaphylaxis), will start Plavix 75mg  after no evidence of Hemorrhage seen on MRI Therapy recommendations:  Outpatient OT, PT Disposition:  Home in the care of his wife   Hypertension Home meds:  Amlodipine 5mg  daily Stable BP goal <180/105 for 24 hours after TpA then allow permissive hypertension (OK if < 220/120) but gradually normalize in 5-7 days  Long-term BP goal normotensive   Hyperlipidemia Home meds: none LDL 113, goal < 70 Add atorvastatin 80mg  daily Continue statin at discharge   Other Stroke Risk Factors Advanced Age >/=  61  Cigarette smoker advised to stop smoking Substance abuse - UDS:  THC POSITIVE, Cocaine NONE DETECTED. Patient advised to stop using due to stroke risk. 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 Unclear if benign or malignant, recommend nonurgent ENT consultation for management as an outpatient. Incidental Covid Positivity:  Consulted critical care medicine.  Patient was sick in late January.  Treated for respiratory infection.  Patient did not test for Covid.  He was vaccinated with JJ in August Enrolled OPTIMISTmain study   DISCHARGE EXAM Blood pressure (!) 141/127, pulse 66, temperature 98.1 F (36.7 C), temperature source Oral, resp. rate 18, height 5' 9.5" (1.765 m), weight 123.5 kg, SpO2 91 %. HYSICAL EXAM   Mental Status: Patient is awake, alert to person, place, and time.    Able to follow complex commands speech is clear without dysarthria or aphasia.  Slight diminished attention, registration and recall.  Diminished recall Cranial Nerves: II: Visual Fields are full Pupils are equal, round, and reactive to light. III,IV, VI: EOMI. Eyelids elevate symmetrically.  V: Facial sensation is symmetric to temperature VII: Facial movement is symmetric.  VIII: hearing is intact to voice X: Uvula elevates symmetrically XI: Shoulder shrug is symmetric. XII: tongue is midline without atrophy or fasciculations.  Motor: Tone is normal. Bulk is normal. 5/5 strength in upper extremities. 4/5 strength in lower extremities  Sensory: Sensation is symmetric to light touch and temperature in the arms and legs.  Coordination: FTN intact bilaterally as well as HKS. No ataxia  Discharge Diet       Diet   Diet Heart Room service appropriate? Yes; Fluid consistency: Thin    liquids DISCHARGE PLAN Disposition:  Home with outpatient  PT and OT Plavix 75mg  daily alone d/t severe ASA allergy (anaphylaxis) Ongoing stroke risk factor control by Primary Care Physician at time of discharge Follow-up PCP [place], Marshall Cork., MD in 2 weeks. Follow-up in [facility] in 4 weeks, office to schedule an appointment.   32 minutes were spent preparing discharge. I have personally obtained history,examined this patient, reviewed notes, independently viewed imaging studies, participated in medical decision making and plan of care.ROS completed by me personally and pertinent positives fully documented  I have made any additions or clarifications directly to the above note. Agree with note above.   Antony Contras, MD Medical Director [facility] Pager: [phone] 10/08/2020 4:46 PM

## 2020-10-08 NOTE — TOC Transition Note (Addendum)
Transition of Care [facility]) - CM/SW Discharge Note   Patient Details  Name: COLUM COLT MRN: [id number] Date of Birth: [birth year]  [facility]) CM/SW Contact:  Pollie Friar, RN Phone Number: 10/08/2020, 11:01 AM   Clinical Narrative:    Patient is discharging home with self care. Outpatient therapy recommended but pt is refusing these services. CM informed him to call his PCP if he changes his mind and want the outpatient therapy. Pt verbalized understanding. MD updated.  Pt has supervision at [facility] and transportation to home.   Final next level of care: Home/Self Care Barriers to Discharge: No Barriers Identified   Patient Goals and CMS Choice     Choice offered to / list presented to : Patient  Discharge Placement                       Discharge Plan and Services                                     Social Determinants of Health (SDOH) Interventions     Readmission Risk Interventions No flowsheet data found.

## 2020-10-08 NOTE — Progress Notes (Signed)
PHARMACIST - PHYSICIAN COMMUNICATION  DR:   Leonie Man  CONCERNING: IV to Oral Route Change Policy  RECOMMENDATION: This patient is receiving Pantoprazole by the intravenous route.  Based on criteria approved by the Pharmacy and Therapeutics Committee, the intravenous medication(s) is/are being converted to the equivalent oral dose form(s).   DESCRIPTION: These criteria include:  The patient is eating (either orally or via tube) and/or has been taking other orally administered medications for a least 24 hours  The patient has no evidence of active gastrointestinal bleeding or impaired GI absorption (gastrectomy, short bowel, patient on TNA or NPO).  If you have questions about this conversion, please contact the Pharmacy Department  []   [phone] )  [facility] []   [phone] )  [facility] [x]   [phone] )  [facility] []   [phone] )  [facility] []   [phone] )  [place], PharmD, [place] Clinical Pharmacist Please see AMION for all Pharmacists' Contact Phone Numbers 10/08/2020, 8:37 AM

## 2020-10-08 NOTE — Plan of Care (Signed)
Adequate for discharge.

## 2020-10-16 ENCOUNTER — Other Ambulatory Visit: Payer: Self-pay | Admitting: *Deleted

## 2020-10-16 NOTE — Patient Outreach (Signed)
[name] [facility]) Care Management  10/16/2020  Ryan Combs July 31, 1951 [id number]   RED ON EMMI ALERT - Stroke Day # 6 Date: 2/24 Red Alert Reason: Smoked or been around smoke   Outreach attempt #1, successful.  Identity verified.  This care manager introduced self and stated purpose of call.  [facility] care management services explained.    He report he is doing well since his diagnosis and discharge, no stroke deficits.  Admits that he is still smoking, does have the desire to quit.  He has follow up appointment with PCP within the next couple weeks, will discuss cessation options at that time.  Advised that there are some over the counter options, he will explore.  Also has follow up with neurology on 3/23.  Denies any urgent concerns, encouraged to contact this care manager with questions.  Plan: RN CM will send smoking cessation resources.  Will close at this time as no further needs identified.  Valente David, [place], MSN [place] [phone]

## 2020-10-16 NOTE — Patient Instructions (Signed)
Managing the Challenge of Quitting Smoking Quitting smoking is a physical and mental challenge. You will face cravings, withdrawal symptoms, and temptation. Before quitting, work with your health care provider to make a plan that can help you manage quitting. Preparation can help you quit and keep you from giving in. How to manage lifestyle changes Managing stress Stress can make you want to smoke, and wanting to smoke may cause stress. It is important to find ways to manage your stress. You might try some of the following:  Practice relaxation techniques. ? Breathe slowly and deeply, in through your nose and out through your mouth. ? Listen to music. ? Soak in a bath or take a shower. ? Imagine a peaceful place or vacation.  Get some support. ? Talk with family or friends about your stress. ? Join a support group. ? Talk with a counselor or therapist.  Get some physical activity. ? Go for a walk, run, or bike ride. ? Play a favorite sport. ? Practice yoga.   Medicines Talk with your health care provider about medicines that might help you deal with cravings and make quitting easier for you. Relationships Social situations can be difficult when you are quitting smoking. To manage this, you can:  Avoid parties and other social situations where people might be smoking.  Avoid alcohol.  Leave right away if you have the urge to smoke.  Explain to your family and friends that you are quitting smoking. Ask for support and let them know you might be a bit grumpy.  Plan activities where smoking is not an option. General instructions Be aware that many people gain weight after they quit smoking. However, not everyone does. To keep from gaining weight, have a plan in place before you quit and stick to the plan after you quit. Your plan should include:  Having healthy snacks. When you have a craving, it may help to: ? Eat popcorn, carrots, celery, or other cut vegetables. ? Chew  sugar-free gum.  Changing how you eat. ? Eat small portion sizes at meals. ? Eat 4-6 small meals throughout the day instead of 1-2 large meals a day. ? Be mindful when you eat. Do not watch television or do other things that might distract you as you eat.  Exercising regularly. ? Make time to exercise each day. If you do not have time for a long workout, do short bouts of exercise for 5-10 minutes several times a day. ? Do some form of strengthening exercise, such as weight lifting. ? Do some exercise that gets your heart beating and causes you to breathe deeply, such as walking fast, running, swimming, or biking. This is very important.  Drinking plenty of water or other low-calorie or no-calorie drinks. Drink 6-8 glasses of water daily.   How to recognize withdrawal symptoms Your body and mind may experience discomfort as you try to get used to not having nicotine in your system. These effects are called withdrawal symptoms. They may include:  Feeling hungrier than normal.  Having trouble concentrating.  Feeling irritable or restless.  Having trouble sleeping.  Feeling depressed.  Craving a cigarette. To manage withdrawal symptoms:  Avoid places, people, and activities that trigger your cravings.  Remember why you want to quit.  Get plenty of sleep.  Avoid coffee and other caffeinated drinks. These may worsen some of your symptoms. These symptoms may surprise you. But be assured that they are normal to have when quitting smoking. How to manage cravings  Come up with a plan for how to deal with your cravings. The plan should include the following:  A definition of the specific situation you want to deal with.  An alternative action you will take.  A clear idea for how this action will help.  The name of someone who might help you with this. Cravings usually last for 5-10 minutes. Consider taking the following actions to help you with your plan to deal with  cravings:  Keep your mouth busy. ? Chew sugar-free gum. ? Suck on hard candies or a straw. ? Brush your teeth.  Keep your hands and body busy. ? Change to a different activity right away. ? Squeeze or play with a ball. ? Do an activity or a hobby, such as making bead jewelry, practicing needlepoint, or working with wood. ? Mix up your normal routine. ? Take a short exercise break. Go for a quick walk or run up and down stairs.  Focus on doing something kind or helpful for someone else.  Call a friend or family member to talk during a craving.  Join a support group.  Contact a quitline. Where to find support To get help or find a support group:  Call the [facility]'s Smoking Quitline: 1-800-QUIT NOW 719-275-7768)  Visit the website of the Substance Abuse and [place]: ktimeonline.com  Text QUIT to SmokefreeTXT: 979892 Where to find more information Visit these websites to find more information on quitting smoking:  [place]: www.smokefree.gov  American Lung Association: www.lung.org  American Cancer Society: www.cancer.org  Centers for Disease Control and Prevention: http://www.wolf.info/  American Heart Association: www.heart.org Contact a health care provider if:  You want to change your plan for quitting.  The medicines you are taking are not helping.  Your eating feels out of control or you cannot sleep. Get help right away if:  You feel depressed or become very anxious. Summary  Quitting smoking is a physical and mental challenge. You will face cravings, withdrawal symptoms, and temptation to smoke again. Preparation can help you as you go through these challenges.  Try different techniques to manage stress, handle social situations, and prevent weight gain.  You can deal with cravings by keeping your mouth busy (such as by chewing gum), keeping your hands and body busy, calling family or friends, or  contacting a quitline for people who want to quit smoking.  You can deal with withdrawal symptoms by avoiding places where people smoke, getting plenty of rest, and avoiding drinks with caffeine. This information is not intended to replace advice given to you by your health care provider. Make sure you discuss any questions you have with your health care provider. Document Revised: 05/28/2019 Document Reviewed: 05/28/2019 Elsevier Patient Education  [place].

## 2020-11-11 ENCOUNTER — Inpatient Hospital Stay: Payer: Self-pay | Admitting: Adult Health

## 2020-11-11 NOTE — Progress Notes (Deleted)
[facility] [address] [place]. [place] [address] [phone]       HOSPITAL FOLLOW UP NOTE  [name] Date of Birth:  1950/12/06 Medical Record Number:  [id number]   Reason for Referral:  hospital stroke follow up    SUBJECTIVE:   CHIEF COMPLAINT:  No chief complaint on file.   HPI:   Ryan A Lanieris a 70 y.o.malewith history of hypertension, COPD, tobacco abusewho presented on 10/06/2020 with confusion.   Personally reviewed hospitalization pertinent progress notes, lab work and imaging with summary provided.  Evaluated by Dr. Leonie Man found to have a strokelike episode consisting of confusion and left-sided weakness s/p tPA with resolution of symptoms.  Initiated Plavix with history of aspirin allergy.  HTN stable on amlodipine.  LDL 113 -initiate atorvastatin 80 mg daily.  Other stroke risk factors include advanced age, current tobacco use, THC use, obesity and COPD.  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  Evaluated by therapies who recommended outpatient PT/OT and discharged home in stable condition.  stroke like episode consisting of confusion and left-sided weakness treated with IV tPA. Symptoms resolved post tPA.  Neurologic work up included:   Code Stroke CT head No acute abnormality.   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 No evidence of atrophy or any old small or large vessel infarction. No abnormality seen to  explain the presenting symptoms.  2D Echo: EF 60-65%, mild LVH  LDL113  HgbA1c6.4  VTE prophylaxis -holding for now until MRI brain is complete an no evidence of  hemorrhage or enlarged infarct  Diet: heart health  No antithromboticprior to admission, patient has an allergy to aspirin (anaphylaxis), will start Plavix 75mg  after no evidence of Hemorrhage seen on MRI  Therapy recommendations:Outpatient OT, PT  Disposition:Home in the care of his wife       ROS:   14 system review of systems performed and negative with exception of ***  PMH:  Past Medical History:  Diagnosis Date  . Angio-edema   . High blood pressure   . Tobacco abuse     PSH:  Past Surgical History:  Procedure Laterality Date  . APPENDECTOMY      Social History:  Social History   Socioeconomic History  . Marital status: Married    Spouse name: Not on file  . Number of children: Not on file  . Years of education: Not on file  . Highest education level: Not on file  Occupational History  . Not on file  Tobacco Use  . Smoking status: Current Every Day Smoker    Packs/day: 1.00    Years: 50.00    Pack years: 50.00    Types: Cigarettes  . Smokeless tobacco: Never Used  Substance and Sexual Activity  . Alcohol use: Never  . Drug use: Never  . Sexual activity: Not on file  Other Topics Concern  . Not on file  Social History Narrative  . Not on file   Social Determinants of Health   Financial Resource Strain: Not on file  Food Insecurity: Not on file  Transportation Needs: Not on file  Physical Activity: Not on file  Stress: Not on file  Social Connections: Not on file  Intimate Partner Violence: Not on file    Family History:  Family History  Problem Relation Age of Onset  . Pancreatic cancer Father     Medications:   Current Outpatient Medications on File Prior to Visit  Medication Sig Dispense Refill  . albuterol (VENTOLIN HFA) 108 (90 Base) MCG/ACT inhaler Inhale 1-2 puffs into the lungs every 6 (six) hours as needed for wheezing or shortness of breath.    Marland Kitchen amLODipine (NORVASC) 5 MG tablet Take 5 mg by mouth daily.    Marland Kitchen  atorvastatin (LIPITOR) 80 MG tablet Take 1 tablet (80 mg total) by mouth daily. 30 tablet 1  . clonazePAM (KLONOPIN) 1 MG tablet Take 1 mg by mouth 3 (three) times daily as needed for anxiety.    . clopidogrel (PLAVIX) 75 MG tablet Take 1 tablet (75 mg total) by mouth daily. 30 tablet 1  . EPINEPHrine 0.3 mg/0.3 mL IJ SOAJ injection Use as directed for life threatening allergic reactions only (Patient taking differently: Inject 0.3 mg into the muscle as needed for anaphylaxis ((life threatening allergic reactions only)).) 2 Device 3  . loratadine (CLARITIN) 10 MG tablet Take 10 mg by mouth daily.    . sildenafil (VIAGRA) 100 MG tablet Take 100 mg by mouth daily as needed for erectile dysfunction.     No current facility-administered medications on file prior to visit.    Allergies:   Allergies  Allergen Reactions  . Aspirin Anaphylaxis      OBJECTIVE:  Physical Exam  There were no vitals filed for this visit. There is no height or weight on file to calculate BMI. No exam data present  No flowsheet data found.   General: well developed, well nourished, seated, in no evident distress Head: head normocephalic and atraumatic.   Neck: supple with no carotid or supraclavicular bruits Cardiovascular: regular rate and rhythm, no murmurs Musculoskeletal: no deformity Skin:  no rash/petichiae Vascular:  Normal pulses all extremities   Neurologic Exam Mental Status: Awake and fully alert. Oriented to place and time. Recent and remote memory intact. Attention span, concentration and fund of knowledge appropriate. Mood and affect appropriate.  Cranial Nerves: Fundoscopic exam reveals sharp disc margins. Pupils equal, briskly reactive to light. Extraocular movements full without nystagmus. Visual fields full to confrontation. Hearing intact. Facial sensation intact. Face, tongue, palate moves normally and symmetrically.  Motor: Normal bulk and tone. Normal strength in all tested extremity  muscles Sensory.: intact to touch , pinprick , position and vibratory sensation.  Coordination: Rapid alternating movements normal in all extremities. Finger-to-nose and heel-to-shin performed accurately bilaterally. Gait and Station: Arises from chair without difficulty. Stance is normal. Gait demonstrates normal stride length and balance with ***. Tandem walk and heel toe ***.  Reflexes: 1+ and symmetric. Toes downgoing.     NIHSS  *** Modified Rankin  ***      ASSESSMENT: GIAN YBARRA is a 70 y.o. year old male presented with confusion and left-sided weakness on 10/06/2020 likely in setting of strokelike episode s/p tPA with resolution of symptoms. Vascular risk factors include HTN, HLD, tobacco use, THC use, obesity and advanced age.      PLAN:  1. Strokelike episode s/p tPA: Residual deficit: ***. Continue clopidogrel 75 mg daily  and atorvastatin 80 mg daily for secondary stroke prevention. *hx of severe aspirin allergy* discussed secondary stroke prevention measures and importance of close PCP follow up for aggressive stroke risk factor management  2. HTN: BP goal <130/90.  Stable on  amlodipine per PCP 3. HLD: LDL goal <70. Recent LDL 113 - started atorvastatin 80 mg daily during recent stroke admission.  4.     Follow up in *** or call earlier if needed   CC:  [facility] provider: Dr. Leonie Man PCP: Enid Skeens., MD    I spent *** minutes of face-to-face and non-face-to-face time with patient.  This included previsit chart review, lab review, study review, order entry, electronic health record documentation, patient education regarding recent stroke, residual deficits, importance of managing stroke risk factors and answered all questions to patient satisfaction     Frann Rider, [facility]  [facility] [address] [place], [place] [address]  Phone [phone] Fax [phone] Note: This document was prepared with digital dictation  and possible smart phrase technology. Any transcriptional errors that result from this process are unintentional.

## 2020-11-16 ENCOUNTER — Ambulatory Visit: Payer: Medicare HMO | Admitting: Adult Health

## 2020-11-16 ENCOUNTER — Encounter: Payer: Self-pay | Admitting: Adult Health

## 2020-11-16 VITALS — BP 147/66 | HR 72 | Ht 70.0 in | Wt 268.0 lb

## 2020-11-16 DIAGNOSIS — R2990 Unspecified symptoms and signs involving the nervous system: Secondary | ICD-10-CM

## 2020-11-16 NOTE — Progress Notes (Signed)
I agree with the above plan 

## 2020-11-16 NOTE — Patient Instructions (Signed)
Continue clopidogrel 75 mg daily for secondary stroke prevention  You are possibly experiencing muscle pains as a side effect of the high-dose statin (atorvastatin).  Recommend holding dose for a few days to see if symptoms improve.  If they improve, recommend restarting atorvastatin 40mg  (half of your 80mg  tablet) but if symptoms return, may need to consider switching to a different type of cholesterol medication which is typically better tolerated such as Crestor  Can also try CoQ10 300mg  daily to help prevent muscle aches and pains associated with statin use  Continue to follow up with PCP regarding cholesterol and blood pressure management  Maintain strict control of hypertension with blood pressure goal below 130/90 and cholesterol with LDL cholesterol (bad cholesterol) goal below 70 mg/dL.       Followup in the future with me in 6 months or call earlier if needed       Thank you for coming to see [place] at [facility]. I hope we have been able to provide you high quality care today.  You may receive a patient satisfaction survey over the next few weeks. We would appreciate your feedback and comments so that we may continue to improve ourselves and the health of our patients.

## 2020-11-16 NOTE — Progress Notes (Signed)
[facility] [address] [place]. [place] [address] [phone]       HOSPITAL FOLLOW UP NOTE  [name] Date of Birth:  August 13, 1951 Medical Record Number:  [id number]   Reason for Referral:  hospital stroke follow up    SUBJECTIVE:   CHIEF COMPLAINT:  Chief Complaint  Patient presents with  . Follow-up    TR alone Pt is well, no symptoms, no complaints     HPI:   RyanRyan A Lanieris a 70 y.o.malewith history of hypertension, COPD, tobacco abusewho presented on 10/06/2020 with confusion.   Personally reviewed hospitalization pertinent progress notes, lab work and imaging with summary provided.  Evaluated by Dr. Leonie Man found to have a strokelike episode consisting of confusion and left-sided weakness s/p tPA with resolution of symptoms.  Initiated Plavix with history of aspirin allergy.  HTN stable on amlodipine.  LDL 113 -initiate atorvastatin 80 mg daily.  Other stroke risk factors include advanced age, current tobacco use, THC use, obesity and COPD.  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  Evaluated by therapies who recommended outpatient PT/OT and discharged home in stable condition.  stroke like episode consisting of confusion and left-sided weakness treated with IV tPA. Symptoms resolved post tPA.  Neurologic work up included:   Code Stroke CT head No acute abnormality.   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 No evidence of atrophy or any old small or large vessel infarction. No abnormality seen to explain the presenting symptoms.  2D Echo: EF 60-65%, mild LVH  LDL113  HgbA1c6.4  VTE prophylaxis  -holding for now until MRI brain is complete an no evidence of hemorrhage or enlarged infarct  Diet: heart health  No antithromboticprior to admission, patient has an allergy to aspirin (anaphylaxis), will start Plavix 75mg  after no evidence of Hemorrhage seen on MRI  Therapy recommendations:Outpatient OT, PT  Disposition:Home in the care of his wife   Today, 11/16/2020, Ryan Combs is being seen for hospital follow-up unaccompanied  He has been doing well since discharge without any reoccurring or new stroke/TIA symptoms He has returned back to all prior activities without difficulty  Compliant on Plavix -denies associated side effects Compliant on atorvastatin 80 mg daily but has been experiencing muscle aches and pains which he did not previously experience Blood pressure today 147/66 which is typical for patient No further concerns at this time      ROS:   14 system review of systems performed and negative with exception of myalgias  PMH:  Past Medical History:  Diagnosis Date  . Angio-edema   . High blood pressure   . Tobacco abuse     PSH:  Past Surgical History:  Procedure Laterality Date  . APPENDECTOMY      Social History:  Social History   Socioeconomic History  . Marital status: Married    Spouse name: Not on file  . Number of children: Not on file  . Years of education: Not on file  . Highest education level: Not on file  Occupational History  . Not on file  Tobacco Use  . Smoking status: Current Every Day Smoker    Packs/day: 1.00    Years: 50.00    Pack  years: 50.00    Types: Cigarettes  . Smokeless tobacco: Never Used  Substance and Sexual Activity  . Alcohol use: Never  . Drug use: Never  . Sexual activity: Not on file  Other Topics Concern  . Not on file  Social History Narrative  . Not on file   Social Determinants of Health   Financial Resource Strain: Not on file  Food Insecurity: Not on file  Transportation Needs: Not  on file  Physical Activity: Not on file  Stress: Not on file  Social Connections: Not on file  Intimate Partner Violence: Not on file    Family History:  Family History  Problem Relation Age of Onset  . Pancreatic cancer Father     Medications:   Current Outpatient Medications on File Prior to Visit  Medication Sig Dispense Refill  . albuterol (VENTOLIN HFA) 108 (90 Base) MCG/ACT inhaler Inhale 1-2 puffs into the lungs every 6 (six) hours as needed for wheezing or shortness of breath.    Marland Kitchen amLODipine (NORVASC) 5 MG tablet Take 5 mg by mouth daily.    Marland Kitchen atorvastatin (LIPITOR) 80 MG tablet Take 1 tablet (80 mg total) by mouth daily. 30 tablet 1  . clonazePAM (KLONOPIN) 1 MG tablet Take 1 mg by mouth 3 (three) times daily as needed for anxiety.    . clopidogrel (PLAVIX) 75 MG tablet Take 1 tablet (75 mg total) by mouth daily. 30 tablet 1  . EPINEPHrine 0.3 mg/0.3 mL IJ SOAJ injection Use as directed for life threatening allergic reactions only (Patient taking differently: Inject 0.3 mg into the muscle as needed for anaphylaxis ((life threatening allergic reactions only)).) 2 Device 3  . loratadine (CLARITIN) 10 MG tablet Take 10 mg by mouth daily.    . sildenafil (VIAGRA) 100 MG tablet Take 100 mg by mouth daily as needed for erectile dysfunction.     No current facility-administered medications on file prior to visit.    Allergies:   Allergies  Allergen Reactions  . Aspirin Anaphylaxis      OBJECTIVE:  Physical Exam  Vitals:   11/16/20 1031  BP: (!) 147/66  Pulse: 72  Weight: 268 lb (121.6 kg)  Height: 5\' 10"  (1.778 m)   Body mass index is 38.45 kg/m. No exam data present  Post stroke PHQ 2/9 Depression screen PHQ 2/9 11/16/2020  Decreased Interest 0  Down, Depressed, Hopeless 0  PHQ - 2 Score 0     General: Obese very pleasant elderly Caucasian male, seated, in no evident distress Head: head normocephalic and atraumatic.   Neck: supple with no carotid or  supraclavicular bruits Cardiovascular: regular rate and rhythm, no murmurs Musculoskeletal: no deformity Skin:  no rash/petichiae Vascular:  Normal pulses all extremities   Neurologic Exam Mental Status: Awake and fully alert.   Fluent speech and language.  Oriented to place and time. Recent and remote memory intact. Attention span, concentration and fund of knowledge appropriate. Mood and affect appropriate.  Cranial Nerves: Pupils equal, briskly reactive to light. Extraocular movements full without nystagmus. Visual fields full to confrontation. Hearing intact. Facial sensation intact. Face, tongue, palate moves normally and symmetrically.  Motor: Normal bulk and tone. Normal strength in all tested extremity muscles Sensory.: intact to touch , pinprick , position and vibratory sensation.  Coordination: Rapid alternating movements normal in all extremities. Finger-to-nose and heel-to-shin performed accurately bilaterally. Gait and Station: Arises from chair without difficulty. Stance is normal. Gait demonstrates normal stride length and balance with without use  of assistive device. Tandem walk and heel toe with mild difficulty.  Reflexes: 1+ and symmetric. Toes downgoing.     NIHSS  0 Modified Rankin  0      ASSESSMENT: Ryan Combs is a 70 y.o. year old male presented with confusion and left-sided weakness on 10/06/2020 likely in setting of strokelike episode s/p tPA with resolution of symptoms. Vascular risk factors include HTN, HLD, tobacco use, THC use, obesity and advanced age.      PLAN:  1. Strokelike episode s/p tPA:  a. Stable without recurring or new stroke/TIA symptoms.   b. Continue clopidogrel 75 mg daily for secondary stroke prevention. *hx of severe aspirin allergy*  c. Possible statin myalgias on atorvastatin 80 mg daily-recommend holding statin for a few days and if symptoms improve, can restart at 40mg  dosage but if symptoms return, would recommend  consideration of switching statin to Crestor which is typically better tolerated d. discussed secondary stroke prevention measures and importance of close PCP follow up for aggressive stroke risk factor management  2. HTN: BP goal <130/90.  Stable on amlodipine per PCP 3. HLD: LDL goal <70. Recent LDL 113 - started atorvastatin 80 mg daily during recent stroke admission but concern of possible myalgias. See #1c. Has f/u with PCP next month for repeat lipid panel.  Also discussed consideration of co-Q10 enzyme which can help reduce or prevent myalgias associated with statin use    Follow up in 6 months or call earlier if needed   CC:  [facility] provider: Dr. Leonie Man PCP: Enid Skeens., MD    I spent 45 minutes of face-to-face and non-face-to-face time with patient.  This included previsit chart review including recent hospitalization pertinent progress notes, lab work and imaging, lab review, study review, order entry, electronic health record documentation, patient education and discussion regarding recent stroke strokelike episode, importance of managing stroke risk factors and ongoing use of statins and answered all other questions to patient satisfaction  Frann Rider, AGNP-BC  [facility] [address] [place], [place] [address]  Phone [phone] Fax [phone] Note: This document was prepared with digital dictation and possible smart phrase technology. Any transcriptional errors that result from this process are unintentional.

## 2020-12-08 ENCOUNTER — Telehealth: Payer: Self-pay | Admitting: Adult Health

## 2020-12-08 NOTE — Telephone Encounter (Signed)
Contacted pt, he requested the wrong medication from wrong provider. We did not prescribe this to him, advised to FU with PCP, he understood.

## 2020-12-08 NOTE — Telephone Encounter (Signed)
Pt is requesting a refill for clonazePAM (KLONOPIN) 1 MG tablet.  Pharmacy:  [facility] [phone]

## 2021-01-07 ENCOUNTER — Encounter: Payer: Self-pay | Admitting: *Deleted

## 2021-01-07 NOTE — Progress Notes (Unsigned)
[facility]imist 90 - 01/07/21 1600      Assessment    Assessment type Phone to patient    Is patient still in hospital? No    Date of hospital discharge after thrombolysis? 10/08/20      Final 90-Day Modified Rankin Score   Final 90-Day Modified Rankin Score: ([facility]) 0-No symptoms from stroke remain, but able to carry out all usual activities      [id number]   Mobility 1- no problems in walking about    Self-care 1- no problems with Self-care    Usual activities 1- no problems with performing usual activities (e.g. work, study, housework, family or leisure activities)    Pain/discomfort 1- no pain or discomfort    Anxiety/Depression 1- not anxious or depressed    What number between 0-100 best describes the patient's health state today (100 means the best health; 0 means the worst health)? [facility] Admission   In the Past 3 months (since your initial hospitalisation for stroke), have you been admitted to hospital (including day-only procedures) for any reason? No      Doctor consultations   In the past 3 months (since your initial hospitalisation for stroke), have you seen any doctors or other health professional (for example physiotherapy, outpatient nurse, general practitioner) for any reason? Yes      a. Doctor consultations   a. Type of service Neurology    a. Condition or purpose follow up    a. Date of appointment 11/16/20

## 2021-01-08 ENCOUNTER — Other Ambulatory Visit: Payer: Self-pay

## 2021-01-08 NOTE — Patient Outreach (Signed)
[facility]) Care Management  01/08/2021  MOSE COLAIZZI 03-18-1951 [id number]   Telephone outreach by Hollice Espy to patient to obtain mRS was successfully completed. MRS= 0.    [place] Management Assistant

## 2021-03-03 ENCOUNTER — Other Ambulatory Visit: Payer: Self-pay | Admitting: Gastroenterology

## 2021-04-15 ENCOUNTER — Encounter (HOSPITAL_COMMUNITY): Payer: Self-pay | Admitting: Gastroenterology

## 2021-04-22 ENCOUNTER — Encounter (HOSPITAL_COMMUNITY)
Admission: RE | Disposition: A | Discharge status: Home / Self Care | Payer: Self-pay | Source: Home / Self Care | Attending: Gastroenterology

## 2021-04-22 ENCOUNTER — Ambulatory Visit (HOSPITAL_COMMUNITY): Payer: Medicare HMO | Admitting: Registered Nurse

## 2021-04-22 ENCOUNTER — Ambulatory Visit (HOSPITAL_COMMUNITY)
Admission: RE | Admit: 2021-04-22 | Discharge: 2021-04-22 | Disposition: A | Discharge status: Home / Self Care | Payer: Medicare HMO | Attending: Gastroenterology | Admitting: Gastroenterology

## 2021-04-22 ENCOUNTER — Encounter (HOSPITAL_COMMUNITY): Payer: Self-pay | Admitting: Gastroenterology

## 2021-04-22 ENCOUNTER — Other Ambulatory Visit: Payer: Self-pay

## 2021-04-22 DIAGNOSIS — Z79899 Other long term (current) drug therapy: Secondary | ICD-10-CM | POA: Insufficient documentation

## 2021-04-22 DIAGNOSIS — K635 Polyp of colon: Secondary | ICD-10-CM | POA: Diagnosis not present

## 2021-04-22 DIAGNOSIS — Z8673 Personal history of transient ischemic attack (TIA), and cerebral infarction without residual deficits: Secondary | ICD-10-CM | POA: Diagnosis not present

## 2021-04-22 DIAGNOSIS — Z886 Allergy status to analgesic agent status: Secondary | ICD-10-CM | POA: Diagnosis not present

## 2021-04-22 DIAGNOSIS — Z800 Family history of malignant neoplasm of digestive organs: Secondary | ICD-10-CM | POA: Diagnosis not present

## 2021-04-22 DIAGNOSIS — Z7951 Long term (current) use of inhaled steroids: Secondary | ICD-10-CM | POA: Insufficient documentation

## 2021-04-22 DIAGNOSIS — D123 Benign neoplasm of transverse colon: Secondary | ICD-10-CM | POA: Diagnosis not present

## 2021-04-22 DIAGNOSIS — F17210 Nicotine dependence, cigarettes, uncomplicated: Secondary | ICD-10-CM | POA: Diagnosis not present

## 2021-04-22 DIAGNOSIS — J449 Chronic obstructive pulmonary disease, unspecified: Secondary | ICD-10-CM | POA: Insufficient documentation

## 2021-04-22 DIAGNOSIS — D122 Benign neoplasm of ascending colon: Secondary | ICD-10-CM | POA: Diagnosis not present

## 2021-04-22 DIAGNOSIS — Z7902 Long term (current) use of antithrombotics/antiplatelets: Secondary | ICD-10-CM | POA: Diagnosis not present

## 2021-04-22 DIAGNOSIS — Z09 Encounter for follow-up examination after completed treatment for conditions other than malignant neoplasm: Secondary | ICD-10-CM | POA: Diagnosis present

## 2021-04-22 HISTORY — PX: BIOPSY: SHX5522

## 2021-04-22 HISTORY — PX: POLYPECTOMY: SHX5525

## 2021-04-22 HISTORY — PX: COLONOSCOPY WITH PROPOFOL: SHX5780

## 2021-04-22 SURGERY — COLONOSCOPY WITH PROPOFOL
Anesthesia: Monitor Anesthesia Care

## 2021-04-22 MED ORDER — LACTATED RINGERS IV SOLN: INTRAVENOUS | Status: DC

## 2021-04-22 MED ORDER — SODIUM CHLORIDE 0.9 % IV SOLN: INTRAVENOUS | Status: DC

## 2021-04-22 MED ORDER — PROPOFOL 1000 MG/100ML IV EMUL
INTRAVENOUS | Status: AC
  Filled 2021-04-22: qty 100

## 2021-04-22 MED ORDER — LACTATED RINGERS IV SOLN: INTRAVENOUS | Status: DC | PRN

## 2021-04-22 MED ORDER — PHENYLEPHRINE HCL (PRESSORS) 10 MG/ML IV SOLN
INTRAVENOUS | Status: AC
  Filled 2021-04-22: qty 1

## 2021-04-22 MED ORDER — LIDOCAINE HCL (CARDIAC) PF 100 MG/5ML IV SOSY
PREFILLED_SYRINGE | INTRAVENOUS | Status: DC | PRN
  Administered 2021-04-22: 75 mg via INTRAVENOUS

## 2021-04-22 MED ORDER — PROPOFOL 500 MG/50ML IV EMUL
INTRAVENOUS | Status: DC | PRN
  Administered 2021-04-22: 150 ug/kg/min via INTRAVENOUS

## 2021-04-22 SURGICAL SUPPLY — 21 items

## 2021-04-22 NOTE — Anesthesia Preprocedure Evaluation (Signed)
Anesthesia Evaluation  Patient identified by MRN, date of birth, ID band Patient awake    Reviewed: Allergy & Precautions, NPO status , Patient's Chart, lab work & pertinent test results  Airway Mallampati: III  TM Distance: >3 FB Neck ROM: Full    Dental no notable dental hx.    Pulmonary sleep apnea and Continuous Positive Airway Pressure Ventilation , COPD, Current Smoker,    + rhonchi  + decreased breath sounds      Cardiovascular hypertension, Normal cardiovascular exam Rhythm:Regular Rate:Normal     Neuro/Psych negative psych ROS   GI/Hepatic negative GI ROS, Neg liver ROS,   Endo/Other  negative endocrine ROS  Renal/GU negative Renal ROS  negative genitourinary   Musculoskeletal negative musculoskeletal ROS (+)   Abdominal   Peds negative pediatric ROS (+)  Hematology negative hematology ROS (+)   Anesthesia Other Findings   Reproductive/Obstetrics negative OB ROS                             Anesthesia Physical Anesthesia Plan  ASA: 3  Anesthesia Plan: MAC   Post-op Pain Management:    Induction: Intravenous  PONV Risk Score and Plan: 1 and Propofol infusion and Treatment may vary due to age or medical condition  Airway Management Planned: Simple Face Mask  Additional Equipment:   Intra-op Plan:   Post-operative Plan:   Informed Consent: I have reviewed the patients History and Physical, chart, labs and discussed the procedure including the risks, benefits and alternatives for the proposed anesthesia with the patient or authorized representative who has indicated his/her understanding and acceptance.     Dental advisory given  Plan Discussed with: CRNA and Surgeon  Anesthesia Plan Comments:         Anesthesia Quick Evaluation

## 2021-04-22 NOTE — Anesthesia Postprocedure Evaluation (Signed)
Anesthesia Post Note  Patient: Ryan Combs  Procedure(s) Performed: COLONOSCOPY WITH PROPOFOL POLYPECTOMY     Patient location during evaluation: PACU Anesthesia Type: MAC Level of consciousness: awake and alert Pain management: pain level controlled Vital Signs Assessment: post-procedure vital signs reviewed and stable Respiratory status: spontaneous breathing, nonlabored ventilation, respiratory function stable and patient connected to nasal cannula oxygen Cardiovascular status: stable and blood pressure returned to baseline Postop Assessment: no apparent nausea or vomiting Anesthetic complications: no   No notable events documented.  Last Vitals:  Vitals:   04/22/21 0901 04/22/21 0912  BP: (!) 108/58 (!) 168/61  Pulse: 66 68  Resp: 18 16  Temp:    SpO2: 98% 92%    Last Pain:  Vitals:   04/22/21 0912  TempSrc:   PainSc: 0-No pain                 Supreme Rybarczyk S

## 2021-04-22 NOTE — Anesthesia Procedure Notes (Addendum)
Procedure Name: MAC Date/Time: 04/22/2021 7:57 AM Performed by: Lissa Morales, CRNA Pre-anesthesia Checklist: Patient identified, Emergency Drugs available, Suction available, Patient being monitored and Timeout performed Patient Re-evaluated:Patient Re-evaluated prior to induction Oxygen Delivery Method: Simple face mask Placement Confirmation: positive ETCO2

## 2021-04-22 NOTE — Discharge Instructions (Signed)
YOU HAD AN ENDOSCOPIC PROCEDURE TODAY: Refer to the procedure report and other information in the discharge instructions given to you for any specific questions about what was found during the examination. If this information does not answer your questions, please call [facility] office at [phone] to clarify.   YOU SHOULD EXPECT: Some feelings of bloating in the abdomen. Passage of more gas than usual. Walking can help get rid of the air that was put into your GI tract during the procedure and reduce the bloating. If you had a lower endoscopy (such as a colonoscopy or flexible sigmoidoscopy) you may notice spotting of blood in your stool or on the toilet paper. Some abdominal soreness may be present for a day or two, also.  DIET: Your first meal following the procedure should be a light meal and then it is ok to progress to your normal diet. A half-sandwich or bowl of soup is an example of a good first meal. Heavy or fried foods are harder to digest and may make you feel nauseous or bloated. Drink plenty of fluids but you should avoid alcoholic beverages for 24 hours. If you had a esophageal dilation, please see attached instructions for diet.    ACTIVITY: Your care partner should take you home directly after the procedure. You should plan to take it easy, moving slowly for the rest of the day. You can resume normal activity the day after the procedure however YOU SHOULD NOT DRIVE, use power tools, machinery or perform tasks that involve climbing or major physical exertion for 24 hours (because of the sedation medicines used during the test).   SYMPTOMS TO REPORT IMMEDIATELY: A gastroenterologist can be reached at any hour. Please call [phone]  for any of the following symptoms:  Following lower endoscopy (colonoscopy, flexible sigmoidoscopy) Excessive amounts of blood in the stool  Significant tenderness, worsening of abdominal pains  Swelling of the abdomen that is new, acute  Fever of 100  or higher  Following upper endoscopy (EGD, EUS, ERCP, esophageal dilation) Vomiting of blood or coffee ground material  New, significant abdominal pain  New, significant chest pain or pain under the shoulder blades  Painful or persistently difficult swallowing  New shortness of breath  Black, tarry-looking or red, bloody stools  FOLLOW UP:  If any biopsies were taken you will be contacted by phone or by letter within the next 1-3 weeks. Call [phone]  if you have not heard about the biopsies in 3 weeks.  Please also call with any specific questions about appointments or follow up tests. YOU HAD AN ENDOSCOPIC PROCEDURE TODAY: Refer to the procedure report and other information in the discharge instructions given to you for any specific questions about what was found during the examination. If this information does not answer your questions, please call [facility] office at [phone] to clarify.   YOU SHOULD EXPECT: Some feelings of bloating in the abdomen. Passage of more gas than usual. Walking can help get rid of the air that was put into your GI tract during the procedure and reduce the bloating. If you had a lower endoscopy (such as a colonoscopy or flexible sigmoidoscopy) you may notice spotting of blood in your stool or on the toilet paper. Some abdominal soreness may be present for a day or two, also.  DIET: Your first meal following the procedure should be a light meal and then it is ok to progress to your normal diet. A half-sandwich or bowl of soup is an   example of a good first meal. Heavy or fried foods are harder to digest and may make you feel nauseous or bloated. Drink plenty of fluids but you should avoid alcoholic beverages for 24 hours. If you had a esophageal dilation, please see attached instructions for diet.    ACTIVITY: Your care partner should take you home directly after the procedure. You should plan to take it easy, moving slowly for the rest of the day. You can resume  normal activity the day after the procedure however YOU SHOULD NOT DRIVE, use power tools, machinery or perform tasks that involve climbing or major physical exertion for 24 hours (because of the sedation medicines used during the test).   SYMPTOMS TO REPORT IMMEDIATELY: A gastroenterologist can be reached at any hour. Please call [phone]  for any of the following symptoms:  Following lower endoscopy (colonoscopy, flexible sigmoidoscopy) Excessive amounts of blood in the stool  Significant tenderness, worsening of abdominal pains  Swelling of the abdomen that is new, acute  Fever of 100 or higher    FOLLOW UP:  If any biopsies were taken you will be contacted by phone or by letter within the next 1-3 weeks. Call [phone]  if you have not heard about the biopsies in 3 weeks.  Please also call with any specific questions about appointments or follow up tests. 

## 2021-04-22 NOTE — Op Note (Signed)
[facility] Patient Name: Ryan Combs Procedure Date: 04/22/2021 MRN: [id number] Attending MD: Ronald Lobo , MD Date of Birth: Sep 11, 1950 CSN: [id number] Age: 70 Admit Type: Outpatient Procedure:                Colonoscopy Indications:              High risk colon cancer surveillance: Personal                            history of adenoma (10 mm or greater in size), High                            risk colon cancer surveillance: Personal history of                            multiple (3 or more) adenomas, Last colonoscopy:                            January 2019. Procedure done at hospital because of                            history of COPD and recent CVA with no residua. Providers:                Ronald Lobo, MD, Mariana Arn, [facility],                            Enrigue Catena, CRNA Referring MD:              Medicines:                Monitored Anesthesia Care Complications:            No immediate complications. Estimated Blood Loss:     Estimated blood loss was minimal. Procedure:                Pre-Anesthesia Assessment:                           - Prior to the procedure, a History and Physical                            was performed, and patient medications and                            allergies were reviewed. The patient's tolerance of                            previous anesthesia was also reviewed. The risks                            and benefits of the procedure and the sedation                            options and risks were discussed with the patient.  All questions were answered, and informed consent                            was obtained. Prior Anticoagulants: The patient has                            taken Plavix (clopidogrel), last dose was 5 days                            prior to procedure. ASA Grade Assessment: III - A                            patient with severe systemic disease. After                             reviewing the risks and benefits, the patient was                            deemed in satisfactory condition to undergo the                            procedure.                           After obtaining informed consent, the colonoscope                            was passed under direct vision. Throughout the                            procedure, the patient's blood pressure, pulse, and                            oxygen saturations were monitored continuously. The                            [id number]) Olympus colonoscope was                            introduced through the anus and advanced to the the                            terminal ileum. The colonoscopy was performed                            without difficulty. The patient tolerated the                            procedure well. The quality of the bowel                            preparation was excellent. Scope In: 8:08:41 AM Scope Out: 8:47:07 AM Scope Withdrawal Time: 0 hours 33 minutes 39 seconds  Total Procedure Duration: 0 hours 38 minutes 26  seconds  Findings:      The perianal and digital rectal examinations were normal. Pertinent       negatives include normal prostate (size, shape, and consistency).      Two sessile polyps were found in the ascending colon. The polyps were       small in size. These polyps were removed with a cold snare. Resection       and retrieval were complete. Estimated blood loss was minimal.      Three sessile polyps were found in the transverse colon. The polyps were       diminutive in size. These were biopsied with a cold forceps for       histology. It appears excision was complete by this method.      A 4 mm polyp was found in the transverse colon. The polyp was sessile.       The polyp was removed with a cold snare. Resection and retrieval were       complete. Estimated blood loss was minimal.      A 5 mm polyp was found in the sigmoid colon. The polyp was sessile. The        polyp was removed with a cold snare. Resection and retrieval were       complete. Estimated blood loss was minimal.      Three pedunculated and sessile and semi-pedunculated polyps were found       in the rectum. The polyps were 4 to 9 mm in size. These polyps were       removed with a cold snare. Resection and retrieval were complete.       Estimated blood loss was minimal.      No other significant abnormalities were identified in a careful       examination of the remainder of the colon. The proximal colon was       re-examined.      The retroflexed view of the distal rectum and anal verge was normal and       showed no anal or rectal abnormalities. Reinspection of the rectum       showed no additional findings. Impression:               - Two small polyps in the ascending colon, removed                            with a cold snare. Resected and retrieved.                           - Three diminutive polyps in the transverse colon.                            Biopsied.                           - One 4 mm polyp in the transverse colon, removed                            with a cold snare. Resected and retrieved.                           - One 5 mm polyp in the sigmoid colon, removed with  a cold snare. Resected and retrieved.                           - Three 4 to 9 mm polyps in the rectum, removed                            with a cold snare. Resected and retrieved.                           - The distal rectum and anal verge are normal on                            retroflexion view. Moderate Sedation:      This patient was sedated with monitored anesthesia care, not moderate       sedation. Recommendation:           - Await pathology results.                           - Repeat colonoscopy in 3 - 5 years for                            surveillance if the patient remains in stable                            health.                           - Resume  Plavix (clopidogrel) at prior dose                            tomorrow. Procedure Code(s):        --- Professional ---                           [phone], Colonoscopy, flexible; with removal of                            tumor(s), polyp(s), or other lesion(s) by snare                            technique                           45380, 76, Colonoscopy, flexible; with biopsy,                            single or multiple Diagnosis Code(s):        --- Professional ---                           K63.5, Polyp of colon                           K62.1, Rectal polyp  Z86.010, Personal history of colonic polyps CPT copyright 2019 [employer]. All rights reserved. The codes documented in this report are preliminary and upon coder review may  be revised to meet current compliance requirements. Ronald Lobo, MD 04/22/2021 9:16:45 AM This report has been signed electronically. Number of Addenda: 0

## 2021-04-22 NOTE — H&P (Signed)
Ryan Combs is an 70 y.o. male.   Chief Complaint: History of colon polyps HPI: This 70 year old gentleman has had multiple colonic adenomatous polyps removed, some as large as 16 mm, most recently in January 2019.  The patient had a stroke in February of this year, fortunately without any significant residual, but has been on Plavix ever since.  He is a daily smoker and has COPD but does not require medication for it nor is she severely compromised by it.  Past Medical History:  Diagnosis Date   Angio-edema    High blood pressure    Tobacco abuse     Past Surgical History:  Procedure Laterality Date   APPENDECTOMY      Family History  Problem Relation Age of Onset   Pancreatic cancer Father    Social History:  reports that he has been smoking cigarettes. He has a 50.00 pack-year smoking history. He has never used smokeless tobacco. He reports that he does not drink alcohol and does not use drugs.  Allergies:  Allergies  Allergen Reactions   Aspirin Anaphylaxis    Medications Prior to Admission  Medication Sig Dispense Refill   albuterol (VENTOLIN HFA) 108 (90 Base) MCG/ACT inhaler Inhale 1-2 puffs into the lungs every 6 (six) hours as needed for wheezing or shortness of breath.     amLODipine (NORVASC) 5 MG tablet Take 5 mg by mouth daily.     clonazePAM (KLONOPIN) 1 MG tablet Take 1 mg by mouth 3 (three) times daily as needed for anxiety.     clopidogrel (PLAVIX) 75 MG tablet Take 1 tablet (75 mg total) by mouth daily. (Patient taking differently: Take 75 mg by mouth at bedtime.) 30 tablet 1   EPINEPHrine 0.3 mg/0.3 mL IJ SOAJ injection Use as directed for life threatening allergic reactions only (Patient taking differently: Inject 0.3 mg into the muscle as needed for anaphylaxis ((life threatening allergic reactions only)).) 2 Device 3   fluticasone (FLONASE) 50 MCG/ACT nasal spray Place 1 spray into both nostrils daily as needed for allergies or rhinitis.      fluticasone-salmeterol (ADVAIR) 250-50 MCG/ACT AEPB Inhale 1 puff into the lungs 2 (two) times daily as needed (asthma).     loratadine (CLARITIN) 10 MG tablet Take 10 mg by mouth daily.     atorvastatin (LIPITOR) 80 MG tablet Take 1 tablet (80 mg total) by mouth daily. 30 tablet 1   sildenafil (VIAGRA) 100 MG tablet Take 100 mg by mouth daily as needed for erectile dysfunction.      No results found for this or any previous visit (from the past 48 hour(s)). No results found.  Review of Systems  Blood pressure (!) 176/71, temperature 98.1 F (36.7 C), temperature source Oral, resp. rate (!) 21, height '5\' 10"'$  (1.778 m), weight 113.4 kg, SpO2 93 %. Physical Exam upbeat, somewhat overweight, gentleman with COPD type phonation, severe actinic changes on his forearms, no pallor or icterus, chest shows diffuse rhonchi, heart sounds are distant but normal, abdomen adipose but nontender.  Assessment/Plan Proceed to surveillance colonoscopy in view of his history of multiple colonic adenomas.  The procedure is being done here at the hospital in view of his history of stroke and his COPD.  Cleotis Nipper, MD 04/22/2021, 7:59 AM

## 2021-04-22 NOTE — Transfer of Care (Signed)
Immediate Anesthesia Transfer of Care Note  Patient: Ryan Combs  Procedure(s) Performed: COLONOSCOPY WITH PROPOFOL POLYPECTOMY  Patient Location: PACU  Anesthesia Type:MAC  Level of Consciousness: awake, oriented, sedated and patient cooperative  Airway & Oxygen Therapy: Patient Spontanous Breathing and Patient connected to face mask oxygen  Post-op Assessment: Report given to RN, Post -op Vital signs reviewed and stable and Patient moving all extremities X 4  Post vital signs: stable  Last Vitals:  Vitals Value Taken Time  BP    Temp    Pulse 61 04/22/21 0856  Resp 15 04/22/21 0857  SpO2 99 % 04/22/21 0856  Vitals shown include unvalidated device data.  Last Pain:  Vitals:   04/22/21 0719  TempSrc: Oral  PainSc: 0-No pain         Complications: No notable events documented.

## 2021-04-23 LAB — SURGICAL PATHOLOGY

## 2021-04-27 ENCOUNTER — Encounter (HOSPITAL_COMMUNITY): Payer: Self-pay | Admitting: Gastroenterology

## 2021-05-17 ENCOUNTER — Encounter: Payer: Self-pay | Admitting: Adult Health

## 2021-05-17 ENCOUNTER — Ambulatory Visit: Payer: Medicare HMO | Admitting: Adult Health

## 2021-05-17 VITALS — BP 164/75 | HR 74 | Ht 70.0 in | Wt 250.0 lb

## 2021-05-17 DIAGNOSIS — R2990 Unspecified symptoms and signs involving the nervous system: Secondary | ICD-10-CM

## 2021-05-17 NOTE — Progress Notes (Signed)
[facility] [address] [place]. [place] [address] [phone]       STROKE FOLLOW UP NOTE  [name] Date of Birth:  1951/06/17 Medical Record Number:  [id number]   Reason for Referral:  stroke follow up    SUBJECTIVE:   CHIEF COMPLAINT:  Chief Complaint  Patient presents with   Follow-up    RM 3 alone PT is well and stable      HPI:   Ryan Combs is a 70 y.o. male with PMHx pertinent for HTN, HLD, tobacco use and strokelike episode consisting of confusion and left-sided weakness s/p tPA with resolution of deficits on 10/06/2020.  He continues to be followed for routine stroke follow-up.  Today, 05/17/2021, Ryan Combs returns for stroke follow-up unaccompanied.  Overall stable.  Denies new or reoccurring stroke/TIA symptoms.  Compliant on Plavix without side effects. He self discontinued atorvastatin due to myalgias with resolution after stopping. Reports OV with PCP in June with labs completed and satisfactory cholesterol levels.  He is not currently on cholesterol medication.  Blood pressure today 164/75. Occasionally monitors at home typically 140-150s.  No new concerns at this time.    History provided for reference purposes only Initial visit 11/16/2020 JM: Ryan Combs is being seen for hospital follow-up unaccompanied  He has been doing well since discharge without any reoccurring or new stroke/TIA symptoms He has returned back to all prior activities without difficulty  Compliant on Plavix -denies associated side effects Compliant on atorvastatin 80 mg daily but has been experiencing muscle aches and pains which he did not previously experience Blood pressure today 147/66 which is typical for patient No further concerns at this time   Stroke admission 10/06/2020 [name] is a 70 y.o. male with history of hypertension, COPD, tobacco abuse who presented on 10/06/2020 with confusion.   Personally reviewed hospitalization  pertinent progress notes, lab work and imaging with summary provided.  Evaluated by Dr. Leonie Man found to have a strokelike episode consisting of confusion and left-sided weakness s/p tPA with resolution of symptoms.  Initiated Plavix with history of aspirin allergy.  HTN stable on amlodipine.  LDL 113 -initiate atorvastatin 80 mg daily.  Other stroke risk factors include advanced age, current tobacco use, THC use, obesity and COPD.  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  Evaluated by therapies who recommended outpatient PT/OT and discharged home in stable condition.  stroke like episode consisting of confusion and left-sided weakness treated with IV tPA. Symptoms resolved post tPA.  Neurologic work up included:   Code Stroke CT head No acute abnormality.  CTA head & neck: no evidence of emergent large vessel occlusion. 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 No evidence of atrophy or any old small or large vessel infarction. No abnormality seen to explain the presenting symptoms. 2D Echo: EF 60-65%, mild LVH LDL 113 HgbA1c 6.4 VTE prophylaxis - holding for now until MRI brain is complete an no evidence of hemorrhage or enlarged infarct Diet: heart health No antithrombotic prior to admission, patient has an allergy to aspirin (anaphylaxis), will start Plavix 75mg  after no evidence of Hemorrhage seen on MRI Therapy recommendations:  Outpatient OT, PT Disposition:  Home in the care of his wife  ROS:   14 system review of systems performed and negative with exception of those listed in HPI  PMH:  Past Medical History:  Diagnosis Date   Angio-edema    High blood pressure    Tobacco abuse     PSH:  Past Surgical History:   Procedure Laterality Date   APPENDECTOMY     BIOPSY  04/22/2021   Procedure: BIOPSY;  Surgeon: Ronald Lobo, MD;  Location: [facility];  Service: Endoscopy;;   COLONOSCOPY WITH PROPOFOL N/A 04/22/2021   Procedure: COLONOSCOPY WITH PROPOFOL;  Surgeon: Ronald Lobo, MD;  Location: [facility];  Service: Endoscopy;  Laterality: N/A;   POLYPECTOMY  04/22/2021   Procedure: POLYPECTOMY;  Surgeon: Ronald Lobo, MD;  Location: [facility];  Service: Endoscopy;;    Social History:  Social History   Socioeconomic History   Marital status: Married    Spouse name: Not on file   Number of children: Not on file   Years of education: Not on file   Highest education level: Not on file  Occupational History   Not on file  Tobacco Use   Smoking status: Every Day    Packs/day: 1.00    Years: 50.00    Pack years: 50.00    Types: Cigarettes   Smokeless tobacco: Never  Substance and Sexual Activity   Alcohol use: Never   Drug use: Never   Sexual activity: Not on file  Other Topics Concern   Not on file  Social History Narrative   Not on file   Social Determinants of Health   Financial Resource Strain: Not on file  Food Insecurity: Not on file  Transportation Needs: Not on file  Physical Activity: Not on file  Stress: Not on file  Social Connections: Not on file  Intimate Partner Violence: Not on file    Family History:  Family History  Problem Relation Age of Onset   Pancreatic cancer Father     Medications:   Current Outpatient Medications on File Prior to Visit  Medication Sig Dispense Refill   albuterol (VENTOLIN HFA) 108 (90 Base) MCG/ACT inhaler Inhale 1-2 puffs into the lungs every 6 (six) hours as needed for wheezing or shortness of breath.     amLODipine (NORVASC) 5 MG tablet Take 5 mg by mouth daily.     atorvastatin (LIPITOR) 80 MG tablet Take 1 tablet (80 mg total) by mouth daily. 30 tablet 1   clonazePAM (KLONOPIN) 1 MG tablet Take 1 mg by mouth 3 (three)  times daily as needed for anxiety.     clopidogrel (PLAVIX) 75 MG tablet Take 1 tablet (75 mg total) by mouth daily. (Patient taking differently: Take 75 mg by mouth at bedtime.) 30 tablet 1   EPINEPHrine 0.3 mg/0.3 mL IJ SOAJ injection Use as directed for life threatening allergic reactions only (Patient taking differently: Inject 0.3 mg into the muscle as needed for anaphylaxis ((life threatening allergic reactions only)).) 2 Device 3   fluticasone (FLONASE) 50 MCG/ACT nasal spray Place 1 spray into both nostrils daily as needed for allergies or rhinitis.     fluticasone-salmeterol (ADVAIR) 250-50 MCG/ACT AEPB Inhale 1 puff into the lungs 2 (two) times daily as needed (asthma).     loratadine (CLARITIN) 10 MG tablet Take 10 mg by mouth daily.     sildenafil (VIAGRA) 100 MG tablet Take 100 mg by mouth daily as needed for erectile dysfunction.     No current facility-administered medications on file prior to visit.  Allergies:   Allergies  Allergen Reactions   Aspirin Anaphylaxis      OBJECTIVE:  Physical Exam  Vitals:   05/17/21 1231  BP: (!) 164/75  Pulse: 74  Weight: 250 lb (113.4 kg)  Height: 5\' 10"  (1.778 m)    Body mass index is 35.87 kg/m. No results found.  General: Obese very pleasant elderly Caucasian male, seated, in no evident distress Head: head normocephalic and atraumatic.   Neck: supple with no carotid or supraclavicular bruits Cardiovascular: regular rate and rhythm, no murmurs Musculoskeletal: no deformity Skin:  no rash/petichiae Vascular:  Normal pulses all extremities   Neurologic Exam Mental Status: Awake and fully alert.   Fluent speech and language.  Oriented to place and time. Recent and remote memory intact. Attention span, concentration and fund of knowledge appropriate. Mood and affect appropriate.  Cranial Nerves: Pupils equal, briskly reactive to light. Extraocular movements full without nystagmus. Visual fields full to confrontation.  Hearing intact. Facial sensation intact. Face, tongue, palate moves normally and symmetrically.  Motor: Normal bulk and tone. Normal strength in all tested extremity muscles Sensory.: intact to touch , pinprick , position and vibratory sensation.  Coordination: Rapid alternating movements normal in all extremities. Finger-to-nose and heel-to-shin performed accurately bilaterally. Gait and Station: Arises from chair without difficulty. Stance is normal. Gait demonstrates normal stride length and balance with without use of assistive device. Tandem walk and heel toe with mild difficulty.  Reflexes: 1+ and symmetric. Toes downgoing.        ASSESSMENT: Ryan Combs is a 70 y.o. year old male presented with confusion and left-sided weakness on 10/06/2020 likely in setting of strokelike episode s/p tPA with resolution of symptoms. Vascular risk factors include HTN, HLD, obesity and advanced age.      PLAN:  Strokelike episode s/p tPA:  Stable without recurring or new stroke/TIA symptoms.   Continue clopidogrel 75 mg daily for secondary stroke prevention. *hx of severe aspirin allergy*  Discussed indication and benefit of being on statin therapy for stroke prevention measures.  Difficulty tolerating atorvastatin -recommend trialing Crestor which typically can be better tolerated- he plans on further discussion with PCP discussed secondary stroke prevention measures and importance of close PCP follow up for aggressive stroke risk factor management  HTN: BP goal <130/90.  Stable on amlodipine per PCP HLD: LDL goal <70.  Intolerant to atorvastatin.  He plans on further discussing with PCP initiating a different statin medication such as Crestor.  Reports completion of cholesterol levels in June with PCP - unable to view via epic    Overall stable from stroke standpoint routinely followed by PCP for aggressive stroke risk factor managemen and recommend follow-up on an as-needed basis which  patient is in agreement with   CC:  [facility] provider: Dr. Leonie Man PCP: Enid Skeens., MD    I spent 29 minutes of face-to-face and non-face-to-face time with patient.  This included previsit chart review, lab review, study review, electronic health record documentation, patient education and discussion regarding prior strokelike episode, importance of managing stroke risk factors and ongoing use of statins and answered all other questions to patient satisfaction  Frann Rider, [facility]  [facility] [address] [place], [place] [address]  Phone [phone] Fax [phone] Note: This document was prepared with digital dictation and possible smart phrase technology. Any transcriptional errors that result from this process are unintentional.

## 2021-05-17 NOTE — Patient Instructions (Addendum)
Continue clopidogrel 75 mg daily for secondary stroke prevention. Would recommend starting on a different type of cholesterol medication for further stroke prevention measures - please further discuss this with your primary doctor   Continue to follow up with PCP regarding cholesterol and blood pressure management  Maintain strict control of hypertension with blood pressure goal below 130/90 and cholesterol with LDL cholesterol (bad cholesterol) goal below 70 mg/dL.        Thank you for coming to see [place] at [facility]. I hope we have been able to provide you high quality care today.  You may receive a patient satisfaction survey over the next few weeks. We would appreciate your feedback and comments so that we may continue to improve ourselves and the health of our patients.

## 2021-05-23 NOTE — Progress Notes (Signed)
I agree with the above plan 

## 2022-03-30 ENCOUNTER — Encounter (INDEPENDENT_AMBULATORY_CARE_PROVIDER_SITE_OTHER): Payer: Medicare HMO | Admitting: Ophthalmology

## 2022-03-30 DIAGNOSIS — H35033 Hypertensive retinopathy, bilateral: Secondary | ICD-10-CM

## 2022-03-30 DIAGNOSIS — H43812 Vitreous degeneration, left eye: Secondary | ICD-10-CM

## 2022-03-30 DIAGNOSIS — H353132 Nonexudative age-related macular degeneration, bilateral, intermediate dry stage: Secondary | ICD-10-CM | POA: Diagnosis not present

## 2022-03-30 DIAGNOSIS — I10 Essential (primary) hypertension: Secondary | ICD-10-CM

## 2022-10-08 ENCOUNTER — Other Ambulatory Visit: Payer: Self-pay | Admitting: Allergy and Immunology

## 2022-10-27 IMAGING — MR MR HEAD W/O CM
7 of 11 series · 25 of 48 positions shown · non-contrast
Comparison: CT studies done yesterday.

CLINICAL DATA: Follow-up stroke

EXAM:
MRI HEAD WITHOUT CONTRAST
TECHNIQUE: Multiplanar, multiecho pulse sequences of the brain and surrounding
structures were obtained without intravenous contrast.

[Series 2: DWI · axial · 3.0mm · 0.94mm/px · z∈[-65,+96]mm · 7 of 110 slices shown (1 of 2)]
[im 1/110]
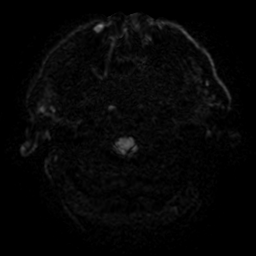
[im 19/110]
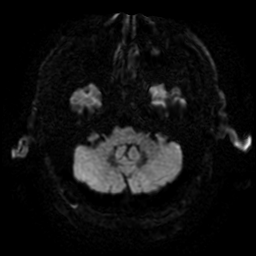
[im 37/110]
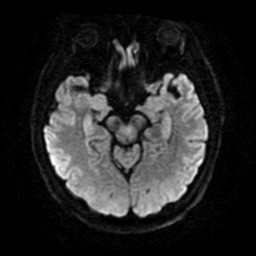
[im 55/110]
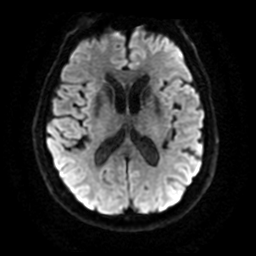
[im 73/110]
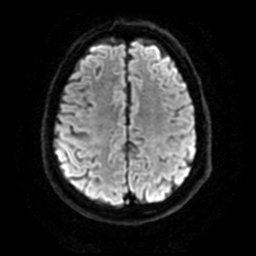
[im 91/110]
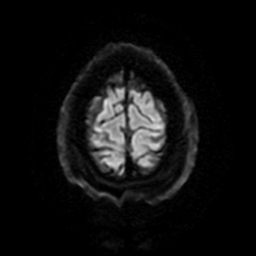
[im 110/110]
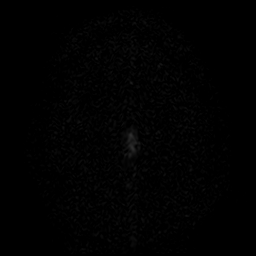

[Series 3: DWI · coronal · 4.0mm · 0.94mm/px · 6 of 80 slices shown (2 of 2)]
[im 1/80]
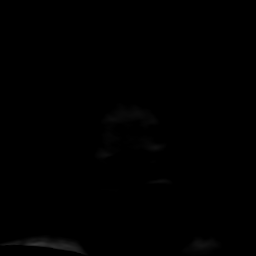
[im 16/80]
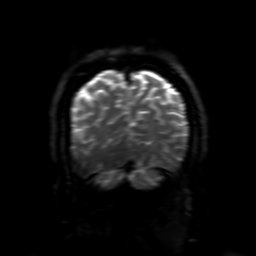
[im 32/80]
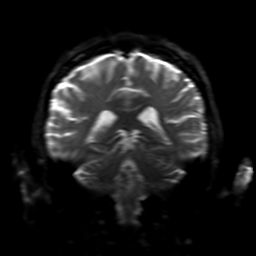
[im 48/80]
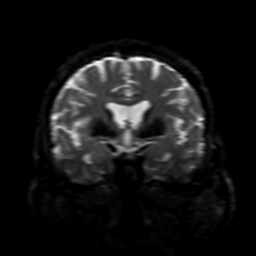
[im 64/80]
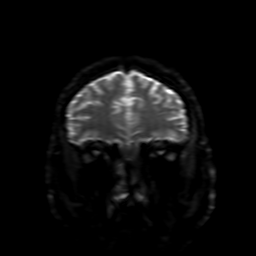
[im 80/80]
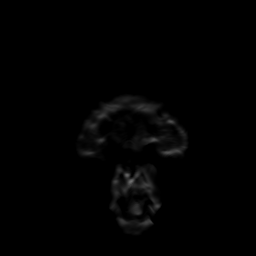

[Series 6: FLAIR · axial · 3.0mm · 0.45mm/px · z∈[-62,+93]mm · 2 of 27 slices shown (1 of 2)]
[im 1/27]
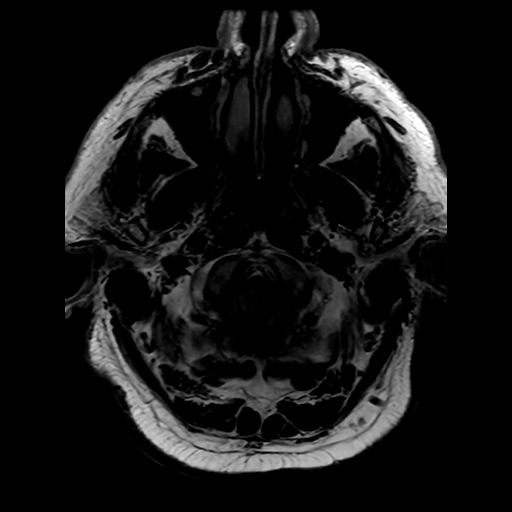
[im 27/27]
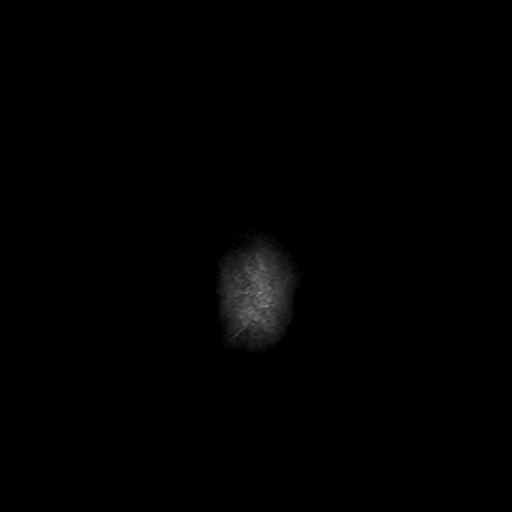

[Series 7: FLAIR · sagittal · 5.0mm · 0.23mm/px · 2 of 30 slices shown (2 of 2)]
[im 1/30]
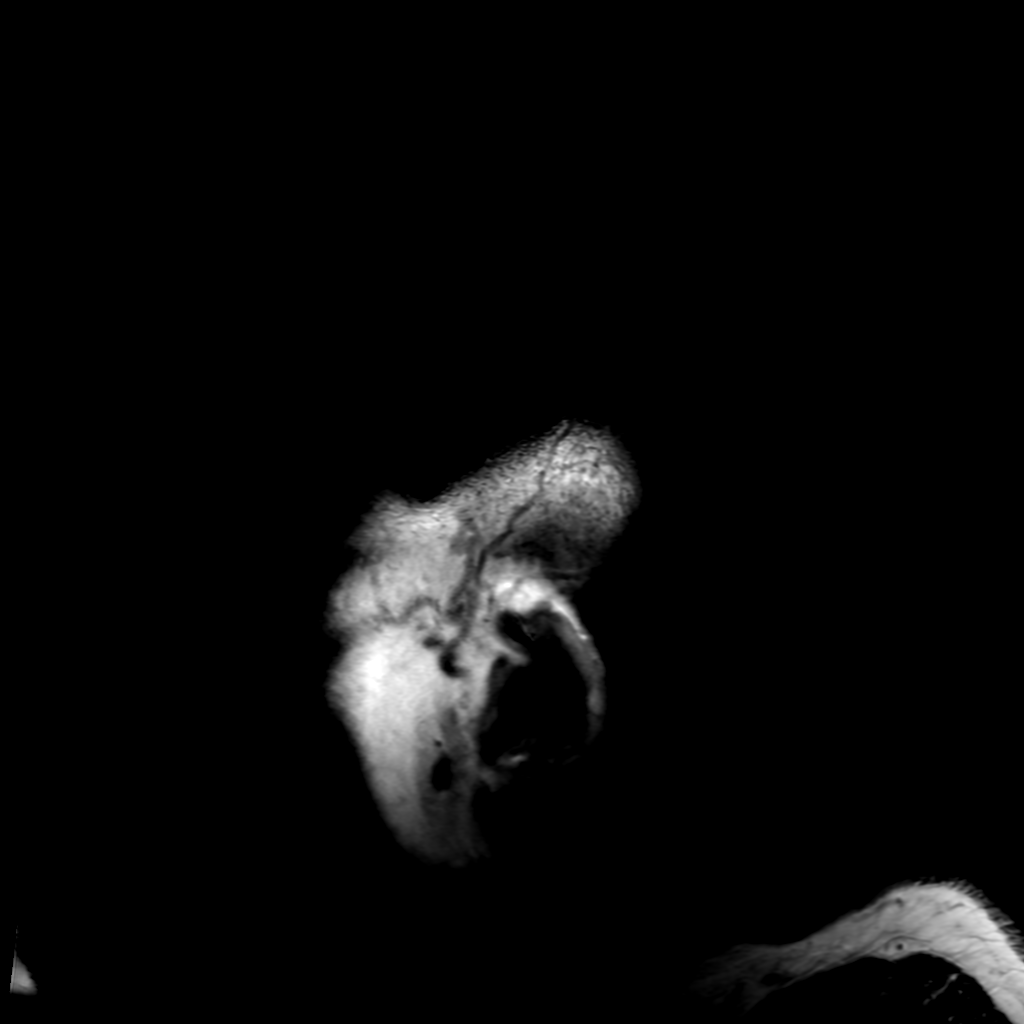
[im 30/30]
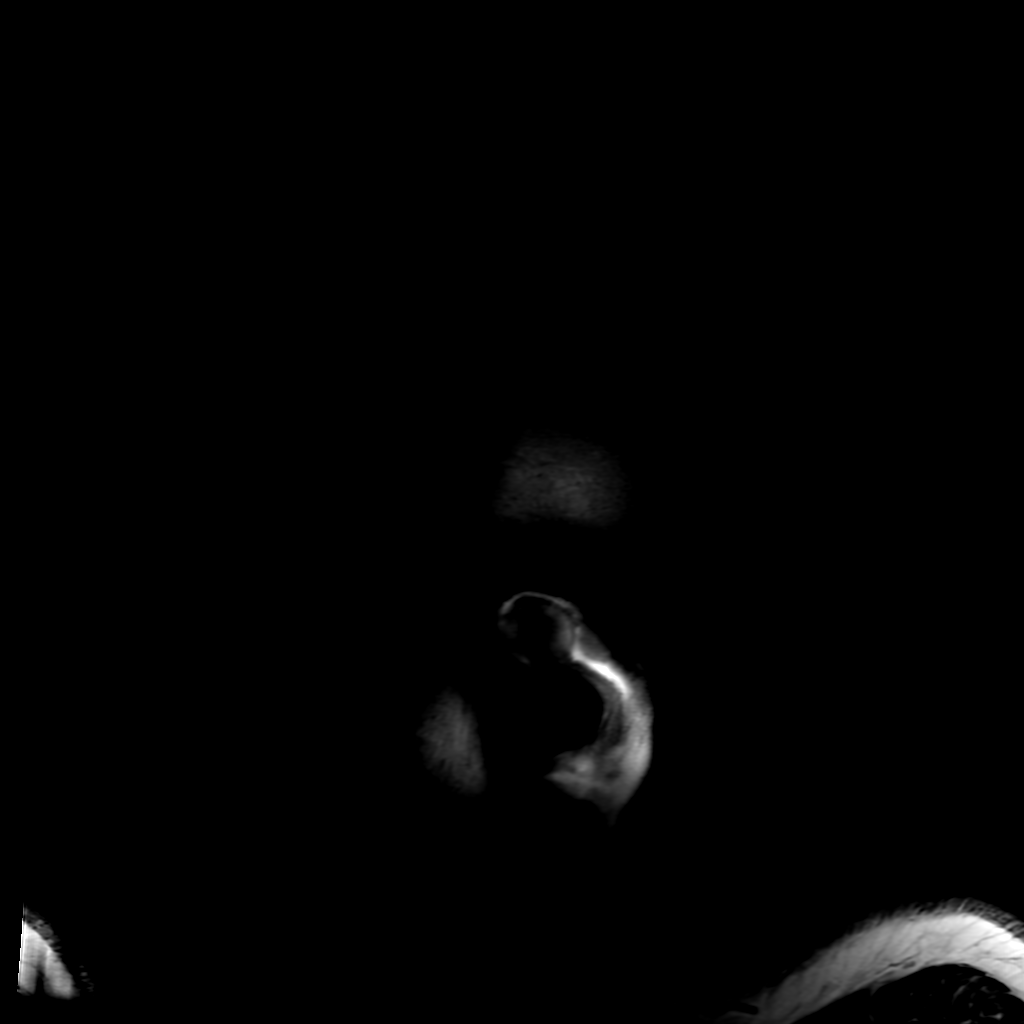

[Series 8: T2 · axial · 5.0mm · 0.23mm/px · 1 of 28 slices shown]
[im 1/28]
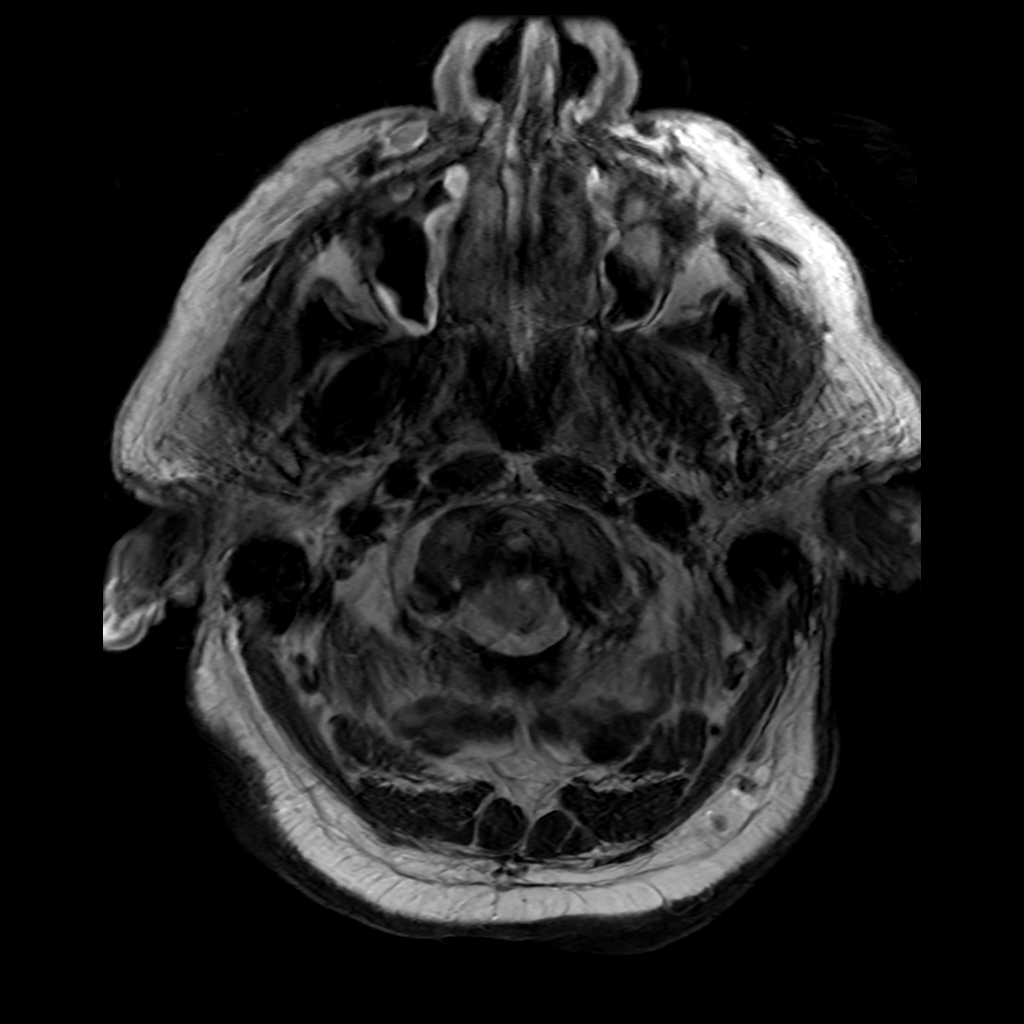

[Series 250: ADC · axial · 3.0mm · 0.94mm/px · z∈[-65,+96]mm · 4 of 55 slices shown (1 of 2)]
[im 1/55]
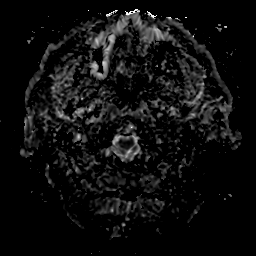
[im 19/55]
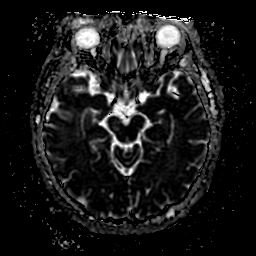
[im 37/55]
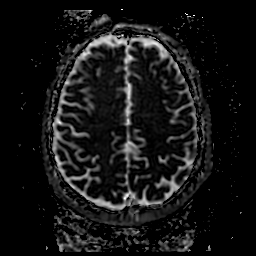
[im 55/55]
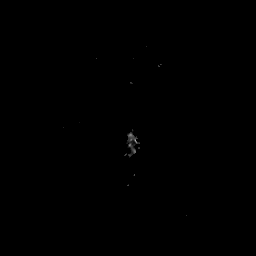

[Series 350: ADC · coronal · 4.0mm · 0.94mm/px · 3 of 40 slices shown (2 of 2)]
[im 1/40]
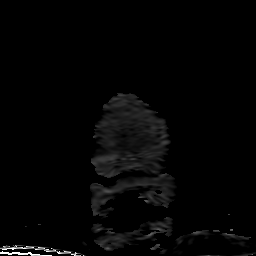
[im 20/40]
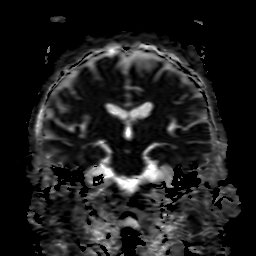
[im 40/40]
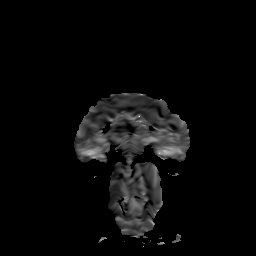

[25 of 48 positions shown; findings below may reference images not displayed]

FINDINGS: Brain: Diffusion imaging does not show any acute or subacute
infarction. No focal abnormality affects the brainstem or
cerebellum. Cerebral hemispheres are likewise normal, remarkable at
this age. No atrophy, small or large vessel infarction, mass lesion,
hemorrhage, hydrocephalus or extra-axial collection.

Vascular: Major vessels at the base of the brain show flow.

Skull and upper cervical spine: Negative

Sinuses/Orbits: Mild seasonal mucosal thickening of the sinuses.
Normal orbits, with bilateral lens implants.

Other: None
IMPRESSION: Normal examination, remarkable at this age. No evidence of atrophy
or any old small or large vessel infarction. No abnormality seen to
explain the presenting symptoms.

## 2023-04-13 ENCOUNTER — Telehealth: Payer: Self-pay | Admitting: *Deleted

## 2023-04-13 ENCOUNTER — Ambulatory Visit: Payer: Medicare HMO | Admitting: Primary Care

## 2023-04-13 ENCOUNTER — Encounter: Payer: Self-pay | Admitting: Primary Care

## 2023-04-13 VITALS — BP 154/66 | HR 66 | Temp 97.6°F | Ht 70.0 in | Wt 209.8 lb

## 2023-04-13 DIAGNOSIS — G4730 Sleep apnea, unspecified: Secondary | ICD-10-CM | POA: Diagnosis not present

## 2023-04-13 NOTE — Progress Notes (Signed)
Reviewed and agree with assessment/plan.   Coralyn Helling, MD [facility] Pulmonary/Critical Care 04/13/2023, 1:04 PM Pager:  [phone]

## 2023-04-13 NOTE — Progress Notes (Signed)
@Patient  ID: Ryan Combs, male    DOB: 11-16-1950, 72 y.o.   MRN: [id number]  Chief Complaint  Patient presents with   Consult    Sleep Consult-snoring    Referring provider: Nonnie Done., MD  HPI: 72 year old male, current smoker. PMH CVA, sleep apnea, tobacco abuse.   04/13/2023 Patient presents today for sleep consult and to establish with our office for OSA management. Former patient of Dr. Rachael Darby in [place]. Wearing CPAP nightly. His current mask is >51 years old and needs to be replaced. He uses full face mask. He snores at night if he does not wear CPAP. He gets 8 hours of sleep a night. Typical bedtime is 11 PM.  It takes him on average 3 minutes to fall asleep.  He starts his day at 7 AM.  He does not operate heavy machinery. No daytimes sleepiness. Epworth score 5. DME is [facility]   Allergies  Allergen Reactions   Aspirin Anaphylaxis     There is no immunization history on file for this patient.  Past Medical History:  Diagnosis Date   Angio-edema    High blood pressure    Tobacco abuse     Tobacco History: Social History   Tobacco Use  Smoking Status Every Day   Current packs/day: 1.00   Average packs/day: 1 pack/day for 50.0 years (50.0 ttl pk-yrs)   Types: Cigarettes  Smokeless Tobacco Never   Ready to quit: Not Answered Counseling given: Not Answered   Outpatient Medications Prior to Visit  Medication Sig Dispense Refill   amLODipine (NORVASC) 5 MG tablet Take 5 mg by mouth daily.     clonazePAM (KLONOPIN) 1 MG tablet Take 1 mg by mouth 3 (three) times daily as needed for anxiety.     EPINEPHrine 0.3 mg/0.3 mL IJ SOAJ injection Use as directed for life threatening allergic reactions only (Patient taking differently: Inject 0.3 mg into the muscle as needed for anaphylaxis ((life threatening allergic reactions only)).) 2 Device 3   loratadine (CLARITIN) 10 MG tablet Take 10 mg by mouth daily.     sildenafil (VIAGRA) 100 MG tablet Take 100 mg by  mouth daily as needed for erectile dysfunction.     albuterol (VENTOLIN HFA) 108 (90 Base) MCG/ACT inhaler Inhale 1-2 puffs into the lungs every 6 (six) hours as needed for wheezing or shortness of breath. (Patient not taking: Reported on 04/13/2023)     fluticasone (FLONASE) 50 MCG/ACT nasal spray Place 1 spray into both nostrils daily as needed for allergies or rhinitis. (Patient not taking: Reported on 04/13/2023)     fluticasone-salmeterol (ADVAIR) 250-50 MCG/ACT AEPB Inhale 1 puff into the lungs 2 (two) times daily as needed (asthma). (Patient not taking: Reported on 04/13/2023)     No facility-administered medications prior to visit.   Review of Systems  Review of Systems  Constitutional: Negative.  Negative for fatigue.  Respiratory: Negative.    Psychiatric/Behavioral: Negative.     Physical Exam  BP (!) 154/66 (BP Location: Right Arm, Cuff Size: Normal)   Pulse 66   Temp 97.6 F (36.4 C) (Temporal)   Ht 5\' 10"  (1.778 m)   Wt 209 lb 12.8 oz (95.2 kg)   SpO2 96%   BMI 30.10 kg/m  Physical Exam Constitutional:      Appearance: Normal appearance.  HENT:     Head: Normocephalic and atraumatic.     Mouth/Throat:     Mouth: Mucous membranes are moist.  Pharynx: Oropharynx is clear.  Cardiovascular:     Rate and Rhythm: Normal rate and regular rhythm.  Skin:    General: Skin is warm and dry.  Neurological:     General: No focal deficit present.     Mental Status: He is alert and oriented to person, place, and time. Mental status is at baseline.  Psychiatric:        Mood and Affect: Mood normal.        Behavior: Behavior normal.        Thought Content: Thought content normal.        Judgment: Judgment normal.      Lab Results:  CBC    Component Value Date/Time   WBC 11.2 (H) 10/06/2020 1600   RBC 4.80 10/06/2020 1600   HGB 14.5 10/06/2020 1600   HGB 14.6 10/06/2020 1600   HGB 13.4 09/24/2018 1425   HCT 45.4 10/06/2020 1600   HCT 43.0 10/06/2020 1600   HCT  41.3 09/24/2018 1425   PLT 225 10/06/2020 1600   PLT 303 09/24/2018 1425   MCV 94.6 10/06/2020 1600   MCV 89 09/24/2018 1425   MCH 30.2 10/06/2020 1600   MCHC 31.9 10/06/2020 1600   RDW 13.9 10/06/2020 1600   RDW 13.3 09/24/2018 1425   LYMPHSABS 2.5 10/06/2020 1600   LYMPHSABS 1.8 09/24/2018 1425   MONOABS 1.1 (H) 10/06/2020 1600   EOSABS 0.2 10/06/2020 1600   EOSABS 0.3 09/24/2018 1425   BASOSABS 0.1 10/06/2020 1600   BASOSABS 0.1 09/24/2018 1425    BMET    Component Value Date/Time   NA 140 10/06/2020 1600   NA 141 10/06/2020 1600   K 3.6 10/06/2020 1600   K 3.6 10/06/2020 1600   CL 103 10/06/2020 1600   CL 104 10/06/2020 1600   CO2 27 10/06/2020 1600   GLUCOSE 130 (H) 10/06/2020 1600   GLUCOSE 128 (H) 10/06/2020 1600   BUN 11 10/06/2020 1600   BUN 13 10/06/2020 1600   CREATININE 0.88 10/06/2020 1600   CREATININE 0.80 10/06/2020 1600   CALCIUM 8.8 (L) 10/06/2020 1600   GFRNONAA >60 10/06/2020 1600    BNP No results found for: "BNP"  ProBNP No results found for: "PROBNP"  Imaging: No results found.   Assessment & Plan:   Sleep apnea - Hx sleep apnea, former patient of Dr. Rosalin Hawking. He reports compliance with CPAP and benefit from wearing. Current machine is >81 years old and needs to be replaced. We have requested sleep study results from [facility] and asked that he bring his machine by DME company for compliance download. We can then proceed with order for new CPAP machine. Advised patient continue to wear CPAP nightly and work on weight loss. FU in 3 months or sooner if needed.    Glenford Bayley, NP 04/13/2023

## 2023-04-13 NOTE — Assessment & Plan Note (Addendum)
-   Hx sleep apnea, former patient of Dr. Rosalin Hawking. He reports compliance with CPAP and benefit from wearing. Current machine is >73 years old and needs to be replaced. We have requested sleep study results from [facility] and asked that he bring his machine by DME company for compliance download. We can then proceed with order for new CPAP machine. Advised patient continue to wear CPAP nightly and work on weight loss. FU in 3 months or sooner if needed.

## 2023-04-13 NOTE — Telephone Encounter (Signed)
Spoke with [place] at [place] [phone] and requested copy of sleep study to be faxed to our office. Patient was seen by Ames Dura, NP for Sleep Consult today.

## 2023-04-13 NOTE — Patient Instructions (Addendum)
Recommendations: - Bring CPAP machine to [facility] for download and have them fax to [facility] Pulmonary Attn: Buelah Manis [phone]   [facility] Address: [address], [place] [address] Hendricks Limes number: [phone])  Orders: Please get sleep study from Dr. Brendolyn Patty in [place] [phone] / or Christoper Allegra   Follow-up: 3 months with Waynetta Sandy NP   CPAP and BIPAP Information CPAP and BIPAP are methods that use air pressure to keep your airways open and to help you breathe well. CPAP and BIPAP use different amounts of pressure. Your health care provider will tell you whether CPAP or BIPAP would be more helpful for you. CPAP stands for "continuous positive airway pressure." With CPAP, the amount of pressure stays the same while you breathe in (inhale) and out (exhale). BIPAP stands for "bi-level positive airway pressure." With BIPAP, the amount of pressure will be higher when you inhale and lower when you exhale. This allows you to take larger breaths. CPAP or BIPAP may be used in the hospital, or your health care provider may want you to use it at home. You may need to have a sleep study before your health care provider can order a machine for you to use at home. What are the advantages? CPAP or BIPAP can be helpful if you have: Sleep apnea. Chronic obstructive pulmonary disease (COPD). Heart failure. Medical conditions that cause muscle weakness, including muscular dystrophy or amyotrophic lateral sclerosis (ALS). Other problems that cause breathing to be shallow, weak, abnormal, or difficult. CPAP and BIPAP are most commonly used for obstructive sleep apnea (OSA) to keep the airways from collapsing when the muscles relax during sleep. What are the risks? Generally, this is a safe treatment. However, problems may occur, including: Irritated skin or skin sores if the mask does not fit properly. Dry or stuffy nose or nosebleeds. Dry mouth. Feeling gassy or bloated. Sinus or lung infection if  the equipment is not cleaned properly. When should CPAP or BIPAP be used? In most cases, the mask only needs to be worn during sleep. Generally, the mask needs to be worn throughout the night and during any daytime naps. People with certain medical conditions may also need to wear the mask at other times, such as when they are awake. Follow instructions from your health care provider about when to use the machine. What happens during CPAP or BIPAP?  Both CPAP and BIPAP are provided by a small machine with a flexible plastic tube that attaches to a plastic mask that you wear. Air is blown through the mask into your nose or mouth. The amount of pressure that is used to blow the air can be adjusted on the machine. Your health care provider will set the pressure setting and help you find the best mask for you. Tips for using the mask Because the mask needs to be snug, some people feel trapped or closed-in (claustrophobic) when first using the mask. If you feel this way, you may need to get used to the mask. One way to do this is to hold the mask loosely over your nose or mouth and then gradually apply the mask more snugly. You can also gradually increase the amount of time that you use the mask. Masks are available in various types and sizes. If your mask does not fit well, talk with your health care provider about getting a different one. Some common types of masks include: Full face masks, which fit over the mouth and nose. Nasal masks, which fit over the  nose. Nasal pillow or prong masks, which fit into the nostrils. If you are using a mask that fits over your nose and you tend to breathe through your mouth, a chin strap may be applied to help keep your mouth closed. Use a skin barrier to protect your skin as told by your health care provider. Some CPAP and BIPAP machines have alarms that may sound if the mask comes off or develops a leak. If you have trouble with the mask, it is very important that  you talk with your health care provider about finding a way to make the mask easier to tolerate. Do not stop using the mask. There could be a negative impact on your health if you stop using the mask. Tips for using the machine Place your CPAP or BIPAP machine on a secure table or stand near an electrical outlet. Know where the on/off switch is on the machine. Follow instructions from your health care provider about how to set the pressure on your machine and when you should use it. Do not eat or drink while the CPAP or BIPAP machine is on. Food or fluids could get pushed into your lungs by the pressure of the CPAP or BIPAP. For home use, CPAP and BIPAP machines can be rented or purchased through home health care companies. Many different brands of machines are available. Renting a machine before purchasing may help you find out which particular machine works well for you. Your health insurance company may also decide which machine you may get. Keep the CPAP or BIPAP machine and attachments clean. Ask your health care provider for specific instructions. Check the humidifier if you have a dry stuffy nose or nosebleeds. Make sure it is working correctly. Follow these instructions at home: Take over-the-counter and prescription medicines only as told by your health care provider. Ask if you can take sinus medicine if your sinuses are blocked. Do not use any products that contain nicotine or tobacco. These products include cigarettes, chewing tobacco, and vaping devices, such as e-cigarettes. If you need help quitting, ask your health care provider. Keep all follow-up visits. This is important. Contact a health care provider if: You have redness or pressure sores on your head, face, mouth, or nose from the mask or head gear. You have trouble using the CPAP or BIPAP machine. You cannot tolerate wearing the CPAP or BIPAP mask. Someone tells you that you snore even when wearing your CPAP or BIPAP. Get  help right away if: You have trouble breathing. You feel confused. Summary CPAP and BIPAP are methods that use air pressure to keep your airways open and to help you breathe well. If you have trouble with the mask, it is very important that you talk with your health care provider about finding a way to make the mask easier to tolerate. Do not stop using the mask. There could be a negative impact to your health if you stop using the mask. Follow instructions from your health care provider about when to use the machine. This information is not intended to replace advice given to you by your health care provider. Make sure you discuss any questions you have with your health care provider. Document Revised: 03/17/2021 Document Reviewed: 07/17/2020 Elsevier Patient Education  2023 [employer].

## 2023-07-18 ENCOUNTER — Ambulatory Visit: Payer: Medicare HMO | Admitting: Primary Care

## 2023-07-18 ENCOUNTER — Encounter: Payer: Self-pay | Admitting: Primary Care

## 2023-07-18 VITALS — BP 156/76 | HR 67 | Temp 97.8°F | Ht 70.0 in | Wt 209.8 lb

## 2023-07-18 DIAGNOSIS — J410 Simple chronic bronchitis: Secondary | ICD-10-CM

## 2023-07-18 DIAGNOSIS — F17200 Nicotine dependence, unspecified, uncomplicated: Secondary | ICD-10-CM

## 2023-07-18 DIAGNOSIS — G4730 Sleep apnea, unspecified: Secondary | ICD-10-CM | POA: Diagnosis not present

## 2023-07-18 NOTE — Patient Instructions (Addendum)
Excellent compliance with CPAP We are requesting updated download from your new CPAP machine Renew supplies x 1 year with [facility] Continue to wear CPAP nightly Start thinking about quitting smoking/ we can re-try wellbutrin and chantix   Referral: Lung cancer screening with    Follow-up 1 year with [facility] NP or sooner if needed  ______________________________________________________________________________________  You can receive free nicotine replacement therapy ( patches, gum or mints) by calling 1-800-QUIT NOW. Please call so we can get you on the path to becoming  a non-smoker. I know it is hard, but you can do this!  Other options for assistance in smoking cessation ( As covered by your insurance benefits)  Hypnosis for smoking cessation  [employer] [phone]  Acupuncture for smoking cessation  [employer] [phone]

## 2023-07-18 NOTE — Progress Notes (Signed)
@Patient  ID: Ryan Combs, male    DOB: 20-Jul-1951, 72 y.o.   MRN: [id number]  Chief Complaint  Patient presents with   Follow-up    Referring provider: Nonnie Done., MD  HPI: 72 year old male, current smoker. PMH CVA, sleep apnea, COPD, tobacco abuse.   Previous [facility] pulmonary encounter:  04/13/2023 Patient presents today for sleep consult and to establish with our office for OSA management. Former patient of Dr. Rachael Darby in [place]. Wearing CPAP nightly. His current mask is >63 years old and needs to be replaced. He uses full face mask. He snores at night if he does not wear CPAP. He gets 8 hours of sleep a night. Typical bedtime is 11 PM.  It takes him on average 3 minutes to fall asleep.  He starts his day at 7 AM.  He does not operate heavy machinery. No daytimes sleepiness. Epworth score 5. DME is [facility]    07/18/2023- Interim hx  Discussed the use of AI scribe software for clinical note transcription with the patient, who gave verbal consent to proceed.  History of Present Illness   The patient, with a history of sleep apnea, COPD, and obesity, presents for a three-month follow-up. He recently received a new CPAP machine through [facility] in September 2024, which he has been using for over a month. The patient reports significant improvement in sleep quality, falling asleep within 30 seconds and only waking up once a night to use the restroom. He is compliant with the CPAP use, even during travel, and reports no daytime sleepiness.  The patient also reports a significant weight loss of 60 pounds over the past year, achieved through adherence to a carnivore diet. He has been monitoring his cholesterol levels, which were last reported at 190.  In addition, the patient has COPD but only uses Advair as needed, which is reportedly rare. He has declined all vaccinations. He is not ready to quit smoking but knows he needs to. Discussed smoking cessation.     [employer] download 02/16/2023 -  05/16/2023 Usage days 76/90 (84%); 73 days (81%) greater than 4 hours Average usage 6 hours 3 minutes Pressure 4 to 17.6 cm H2O (13.9 cm H2O-95%) Air leaks 13.6 L/min (95%) AHI 1.4  [employer] download 06/19/2023 - 07/18/2023 Usage days 29/30 days (97%); 27 days (90 percent) greater than 4 hours Average usage 7 hours 50 minutes Pressure 4 to 20 cm H2O (14.7cm h20-95%) Air leaks 4.6 L/min (95%) AHI 0.9  Allergies  Allergen Reactions   Aspirin Anaphylaxis     There is no immunization history on file for this patient.  Past Medical History:  Diagnosis Date   Angio-edema    High blood pressure    Tobacco abuse     Tobacco History: Social History   Tobacco Use  Smoking Status Every Day   Current packs/day: 1.00   Average packs/day: 1 pack/day for 50.0 years (50.0 ttl pk-yrs)   Types: Cigarettes  Smokeless Tobacco Never  Tobacco Comments   Pt states he still smokes 1 ppd. AB, CMA 07-18-23   Ready to quit: No Counseling given: Yes Tobacco comments: Pt states he still smokes 1 ppd. AB, CMA 07-18-23   Outpatient Medications Prior to Visit  Medication Sig Dispense Refill   albuterol (VENTOLIN HFA) 108 (90 Base) MCG/ACT inhaler Inhale 1-2 puffs into the lungs every 6 (six) hours as needed for wheezing or shortness of breath.     amLODipine (NORVASC) 5 MG tablet Take 5 mg  by mouth daily.     clonazePAM (KLONOPIN) 1 MG tablet Take 1 mg by mouth 3 (three) times daily as needed for anxiety.     EPINEPHrine 0.3 mg/0.3 mL IJ SOAJ injection Use as directed for life threatening allergic reactions only (Patient taking differently: Inject 0.3 mg into the muscle as needed for anaphylaxis ((life threatening allergic reactions only)).) 2 Device 3   fluticasone-salmeterol (ADVAIR) 250-50 MCG/ACT AEPB Inhale 1 puff into the lungs 2 (two) times daily as needed (asthma).     loratadine (CLARITIN) 10 MG tablet Take 10 mg by mouth daily.     sildenafil (VIAGRA) 100 MG tablet Take 100 mg by mouth  daily as needed for erectile dysfunction.     fluticasone (FLONASE) 50 MCG/ACT nasal spray Place 1 spray into both nostrils daily as needed for allergies or rhinitis. (Patient not taking: Reported on 04/13/2023)     No facility-administered medications prior to visit.      Review of Systems  Review of Systems  Constitutional: Negative.  Negative for fatigue.  HENT:  Positive for congestion.   Respiratory:  Positive for cough. Negative for chest tightness, shortness of breath and wheezing.   Cardiovascular: Negative.      Physical Exam  BP (!) 156/76 (BP Location: Left Arm, Patient Position: Sitting, Cuff Size: Normal)   Pulse 67   Temp 97.8 F (36.6 C) (Oral)   Ht 5\' 10"  (1.778 m)   Wt 209 lb 12.8 oz (95.2 kg)   SpO2 94%   BMI 30.10 kg/m  Physical Exam Constitutional:      Appearance: Normal appearance.  HENT:     Head: Normocephalic and atraumatic.  Cardiovascular:     Rate and Rhythm: Normal rate and regular rhythm.  Pulmonary:     Effort: Pulmonary effort is normal.     Breath sounds: Normal breath sounds.  Musculoskeletal:        General: Normal range of motion.  Skin:    General: Skin is warm and dry.  Neurological:     General: No focal deficit present.     Mental Status: He is alert and oriented to person, place, and time. Mental status is at baseline.  Psychiatric:        Mood and Affect: Mood normal.        Behavior: Behavior normal.        Thought Content: Thought content normal.        Judgment: Judgment normal.      Lab Results:  CBC    Component Value Date/Time   WBC 11.2 (H) 10/06/2020 1600   RBC 4.80 10/06/2020 1600   HGB 14.5 10/06/2020 1600   HGB 14.6 10/06/2020 1600   HGB 13.4 09/24/2018 1425   HCT 45.4 10/06/2020 1600   HCT 43.0 10/06/2020 1600   HCT 41.3 09/24/2018 1425   PLT 225 10/06/2020 1600   PLT 303 09/24/2018 1425   MCV 94.6 10/06/2020 1600   MCV 89 09/24/2018 1425   MCH 30.2 10/06/2020 1600   MCHC 31.9 10/06/2020 1600    RDW 13.9 10/06/2020 1600   RDW 13.3 09/24/2018 1425   LYMPHSABS 2.5 10/06/2020 1600   LYMPHSABS 1.8 09/24/2018 1425   MONOABS 1.1 (H) 10/06/2020 1600   EOSABS 0.2 10/06/2020 1600   EOSABS 0.3 09/24/2018 1425   BASOSABS 0.1 10/06/2020 1600   BASOSABS 0.1 09/24/2018 1425    BMET    Component Value Date/Time   NA 140 10/06/2020 1600   NA 141 10/06/2020  1600   K 3.6 10/06/2020 1600   K 3.6 10/06/2020 1600   CL 103 10/06/2020 1600   CL 104 10/06/2020 1600   CO2 27 10/06/2020 1600   GLUCOSE 130 (H) 10/06/2020 1600   GLUCOSE 128 (H) 10/06/2020 1600   BUN 11 10/06/2020 1600   BUN 13 10/06/2020 1600   CREATININE 0.88 10/06/2020 1600   CREATININE 0.80 10/06/2020 1600   CALCIUM 8.8 (L) 10/06/2020 1600   GFRNONAA >60 10/06/2020 1600    BNP No results found for: "BNP"  ProBNP No results found for: "PROBNP"  Imaging: No results found.   Assessment & Plan:   1. Sleep apnea, unspecified type (Primary)  2. Simple chronic bronchitis (HCC)  3. Current smoker - Ambulatory Referral for Lung Cancer Scre     Obstructive Sleep Apnea Patient reports good compliance with new CPAP machine and improved sleep quality. No daytime sleepiness reported. -Continue CPAP use nightly. -Request updated CPAP data from [facility]. -Annual follow-up for CPAP supply renewal in November 2025.  Weight Loss Significant weight loss achieved through adherence to a carnivore diet. Stable weight since last visit in August.  Simple chronic bronchitis  Current smoker. No acute respiratory complaints. Chronic cough without purulence. Infrequent use of Advair -Continue Advair as needed.  General Health Maintenance -Declined flu and RSV vaccines.     Glenford Bayley, NP 07/18/2023

## 2023-08-01 ENCOUNTER — Other Ambulatory Visit: Payer: Self-pay

## 2023-08-01 DIAGNOSIS — Z122 Encounter for screening for malignant neoplasm of respiratory organs: Secondary | ICD-10-CM

## 2023-08-01 DIAGNOSIS — F17210 Nicotine dependence, cigarettes, uncomplicated: Secondary | ICD-10-CM

## 2023-08-01 DIAGNOSIS — Z87891 Personal history of nicotine dependence: Secondary | ICD-10-CM

## 2023-08-04 ENCOUNTER — Ambulatory Visit (INDEPENDENT_AMBULATORY_CARE_PROVIDER_SITE_OTHER): Payer: Medicare HMO | Admitting: Acute Care

## 2023-08-04 DIAGNOSIS — F17210 Nicotine dependence, cigarettes, uncomplicated: Secondary | ICD-10-CM

## 2023-08-04 NOTE — Progress Notes (Addendum)
 Provider Attestation I agree with the documentation of the Shared Decision Making visit,  smoking cessation counseling if appropriate, and verification or eligibility for lung cancer screening as documented by the RN Nurse Navigator.   Lauraine PHEBE Lites, MSN, AGACNP-BC [facility] See Amion for personal pager PCCM on call pager [phone]    Virtual Visit via Telephone Note  I connected with Ryan Combs on 08/04/23 at 10:00 AM EST by telephone and verified that I am speaking with the correct person using two identifiers.  Location: Patient: Ryan Combs. Combs Provider: Laneta Speaks, RN   I discussed the limitations, risks, security and privacy concerns of performing an evaluation and management service by telephone and the availability of in person appointments. I also discussed with the patient that there may be a patient responsible charge related to this service. The patient expressed understanding and agreed to proceed.    Shared Decision Making Visit Lung Cancer Screening Program [phone])   Eligibility: Age 72 y.o. Pack Years Smoking History Calculation 114 (# packs/per year x # years smoked) Recent History of coughing up blood  no Unexplained weight loss? no ( >Than 15 pounds within the last 6 months ) Prior History Lung / other cancer no (Diagnosis within the last 5 years already requiring surveillance chest CT Scans). Smoking Status Current Smoker Former Smokers: Years since quit: N/A  Quit Date: N/A  Visit Components: Discussion included one or more decision making aids. yes Discussion included risk/benefits of screening. yes Discussion included potential follow up diagnostic testing for abnormal scans. yes Discussion included meaning and risk of over diagnosis. yes Discussion included meaning and risk of False Positives. yes Discussion included meaning of total radiation exposure. yes  Counseling Included: Importance of  adherence to annual lung cancer LDCT screening. yes Impact of comorbidities on ability to participate in the program. yes Ability and willingness to under diagnostic treatment. yes  Smoking Cessation Counseling: Current Smokers:  Discussed importance of smoking cessation. yes Information about tobacco cessation classes and interventions provided to patient. yes Patient provided with ticket for LDCT Scan. no Symptomatic Patient. no  Counseling(Intermediate counseling: > three minutes) 99406 Diagnosis Code: Tobacco Use Z72.0 Asymptomatic Patient yes  Counseling (Intermediate counseling: > three minutes counseling) [id number] Former Smokers:  Discussed the importance of maintaining cigarette abstinence. yes Diagnosis Code: Personal History of Nicotine Dependence. S12.108 Information about tobacco cessation classes and interventions provided to patient. Yes Patient provided with ticket for LDCT Scan. no Written Order for Lung Cancer Screening with LDCT placed in Epic. Yes (CT Chest Lung Cancer Screening Low Dose W/O CM) [id number] Z12.2-Screening of respiratory organs Z87.891-Personal history of nicotine dependence   Laneta Speaks, RN

## 2023-08-04 NOTE — Patient Instructions (Signed)

## 2023-08-09 ENCOUNTER — Ambulatory Visit
Admission: RE | Admit: 2023-08-09 | Discharge: 2023-08-09 | Disposition: A | Discharge status: Home / Self Care | Payer: Medicare HMO | Source: Ambulatory Visit | Attending: Family Medicine | Admitting: Family Medicine

## 2023-08-09 DIAGNOSIS — F17210 Nicotine dependence, cigarettes, uncomplicated: Secondary | ICD-10-CM

## 2023-08-09 DIAGNOSIS — Z122 Encounter for screening for malignant neoplasm of respiratory organs: Secondary | ICD-10-CM

## 2023-08-09 DIAGNOSIS — Z87891 Personal history of nicotine dependence: Secondary | ICD-10-CM

## 2023-08-22 ENCOUNTER — Telehealth: Payer: Self-pay | Admitting: *Deleted

## 2023-08-22 ENCOUNTER — Telehealth: Payer: Self-pay

## 2023-08-22 NOTE — Telephone Encounter (Signed)
 Tiffany calling with call report

## 2023-08-22 NOTE — Telephone Encounter (Signed)
 LVM to call and review CT results. Needs repeat CT in 6months for Rads3 nodule.

## 2023-08-22 NOTE — Telephone Encounter (Signed)
 Call report received: IMPRESSION: 1. Lung-RADS 3, probably benign findings. Short-term follow-up in 6 months is recommended with repeat low-dose chest CT without contrast (please use the following order, CT CHEST LCS NODULE FOLLOW-UP W/O CM). 2. Aortic Atherosclerosis (ICD10-I70.0) and Emphysema (ICD10-J43.9).

## 2023-08-24 ENCOUNTER — Telehealth: Payer: Self-pay | Admitting: Acute Care

## 2023-08-24 ENCOUNTER — Other Ambulatory Visit: Payer: Self-pay

## 2023-08-24 DIAGNOSIS — Z122 Encounter for screening for malignant neoplasm of respiratory organs: Secondary | ICD-10-CM

## 2023-08-24 DIAGNOSIS — Z87891 Personal history of nicotine dependence: Secondary | ICD-10-CM

## 2023-08-24 DIAGNOSIS — F17210 Nicotine dependence, cigarettes, uncomplicated: Secondary | ICD-10-CM

## 2023-08-24 DIAGNOSIS — R911 Solitary pulmonary nodule: Secondary | ICD-10-CM

## 2023-08-24 NOTE — Telephone Encounter (Signed)
 I have attempted to call the patient with the results of their low dose CT. There was no answer. I left a HIPPA compliant message on their VM with the office contact number requesting that they call [phone] to review the results of the scan.  Ladies, it is ok to call him with his results.t needs a 6 month follow up. Thanks

## 2023-08-24 NOTE — Telephone Encounter (Signed)
 Spoke with patient and reviewed CT results. He will repeat scan in 6 months. States he previously discussed his cholesterol with his PCP and he does not want to be placed on a statin. States he will discuss this with his PCP at next follow up appt. Results forwarded to PCP with plan.

## 2023-11-05 ENCOUNTER — Ambulatory Visit (HOSPITAL_BASED_OUTPATIENT_CLINIC_OR_DEPARTMENT_OTHER)
Admission: EM | Admit: 2023-11-05 | Discharge: 2023-11-05 | Disposition: A | Discharge status: Home / Self Care | Attending: Family Medicine | Admitting: Family Medicine

## 2023-11-05 ENCOUNTER — Encounter (HOSPITAL_BASED_OUTPATIENT_CLINIC_OR_DEPARTMENT_OTHER): Payer: Self-pay

## 2023-11-05 DIAGNOSIS — R0602 Shortness of breath: Secondary | ICD-10-CM | POA: Diagnosis not present

## 2023-11-05 DIAGNOSIS — J441 Chronic obstructive pulmonary disease with (acute) exacerbation: Secondary | ICD-10-CM | POA: Diagnosis not present

## 2023-11-05 MED ORDER — ALBUTEROL SULFATE (2.5 MG/3ML) 0.083% IN NEBU: 2.5000 mg | INHALATION_SOLUTION | Freq: Four times a day (QID) | RESPIRATORY_TRACT | 12 refills | Status: AC | PRN

## 2023-11-05 MED ORDER — IPRATROPIUM-ALBUTEROL 0.5-2.5 (3) MG/3ML IN SOLN
3.0000 mL | Freq: Once | RESPIRATORY_TRACT | Status: AC
  Administered 2023-11-05: 3 mL via RESPIRATORY_TRACT

## 2023-11-05 MED ORDER — AMOXICILLIN-POT CLAVULANATE 875-125 MG PO TABS: 1.0000 | ORAL_TABLET | Freq: Two times a day (BID) | ORAL | 0 refills | Status: DC

## 2023-11-05 MED ORDER — ALBUTEROL SULFATE (2.5 MG/3ML) 0.083% IN NEBU: 2.5000 mg | INHALATION_SOLUTION | Freq: Once | RESPIRATORY_TRACT | Status: DC

## 2023-11-05 MED ORDER — PREDNISONE 20 MG PO TABS: 40.0000 mg | ORAL_TABLET | Freq: Every day | ORAL | 0 refills | Status: AC

## 2023-11-05 NOTE — Discharge Instructions (Signed)
 COPD exacerbation and possible infection in the lungs. You can do the albuterol treatments every 4-6 hours for cough, wheezing or shortness of breath Take the medication as prescribed. If symptoms worsen and your breathing becomes worse she will need to go to the ER

## 2023-11-05 NOTE — ED Triage Notes (Addendum)
 Saw family doctor Tuesday for cough, body aches. Given a shot of kenalog. States had been feeling better but today feeling short of breath. O2 sat 86% room air. Difficulty conversing without stopping for breath. 1+ppd smoker

## 2023-11-05 NOTE — ED Provider Notes (Signed)
 [facility]    CSN: [id number] Arrival date & time: 11/05/23  1314      History   Chief Complaint Chief Complaint  Patient presents with   Cough    HPI Ryan Combs is a 73 y.o. male.   73 year old male presents today with cough, congestion, shortness of breath, body aches.  Symptoms have been going on for over a week.  Saw his primary care on Tuesday and was given a shot of Kenalog.  Reports possibly some mild relief with that but getting worse.  Does have a past medical history of COPD.  Has also been using inhaler without much relief.  No reported fever, chills.   Cough   Past Medical History:  Diagnosis Date   Angio-edema    High blood pressure    Tobacco abuse     Patient Active Problem List   Diagnosis Date Noted   Sleep apnea 04/13/2023   Stroke (HCC) 10/06/2020    Past Surgical History:  Procedure Laterality Date   APPENDECTOMY     BIOPSY  04/22/2021   Procedure: BIOPSY;  Surgeon: Bernette Redbird, MD;  Location: [facility];  Service: Endoscopy;;   COLONOSCOPY WITH PROPOFOL N/A 04/22/2021   Procedure: COLONOSCOPY WITH PROPOFOL;  Surgeon: Bernette Redbird, MD;  Location: [facility];  Service: Endoscopy;  Laterality: N/A;   POLYPECTOMY  04/22/2021   Procedure: POLYPECTOMY;  Surgeon: Bernette Redbird, MD;  Location: [facility];  Service: Endoscopy;;       Home Medications    Prior to Admission medications   Medication Sig Start Date End Date Taking? Authorizing Provider  albuterol (PROVENTIL) (2.5 MG/3ML) 0.083% nebulizer solution Take 3 mLs (2.5 mg total) by nebulization every 6 (six) hours as needed for wheezing or shortness of breath. 11/05/23  Yes Arnette Driggs A, FNP  amoxicillin-clavulanate (AUGMENTIN) 875-125 MG tablet Take 1 tablet by mouth every 12 (twelve) hours. 11/05/23  Yes Anali Cabanilla A, FNP  predniSONE (DELTASONE) 20 MG tablet Take 2 tablets (40 mg total) by mouth daily with breakfast for 5 days. 11/05/23 11/10/23 Yes Kaylub Detienne A,  FNP  albuterol (VENTOLIN HFA) 108 (90 Base) MCG/ACT inhaler Inhale 1-2 puffs into the lungs every 6 (six) hours as needed for wheezing or shortness of breath.    [provider]  amLODipine (NORVASC) 5 MG tablet Take 5 mg by mouth daily. 09/10/18   [provider]  clonazePAM (KLONOPIN) 1 MG tablet Take 1 mg by mouth 3 (three) times daily as needed for anxiety. 09/14/18   [provider]  EPINEPHrine 0.3 mg/0.3 mL IJ SOAJ injection Use as directed for life threatening allergic reactions only Patient taking differently: Inject 0.3 mg into the muscle as needed for anaphylaxis ((life threatening allergic reactions only)). 09/19/18   Kozlow, Alvira Philips, MD  fluticasone-salmeterol (ADVAIR) 250-50 MCG/ACT AEPB Inhale 1 puff into the lungs 2 (two) times daily as needed (asthma).    [provider]  loratadine (CLARITIN) 10 MG tablet Take 10 mg by mouth daily.    [provider]  sildenafil (VIAGRA) 100 MG tablet Take 100 mg by mouth daily as needed for erectile dysfunction. 08/28/20   [provider]    Family History Family History  Problem Relation Age of Onset   Pancreatic cancer Father     Social History Social History   Tobacco Use   Smoking status: Every Day    Current packs/day: 2.00    Average packs/day: 2.0 packs/day for 57.2 years (114.4 ttl  pk-yrs)    Types: Cigarettes    Start date: 1968   Smokeless tobacco: Never   Tobacco comments:    Pt states he still smokes 1 ppd. AB, CMA 07-18-23  Substance Use Topics   Alcohol use: Never   Drug use: Never     Allergies   Aspirin   Review of Systems Review of Systems  Respiratory:  Positive for cough.      Physical Exam Triage Vital Signs ED Triage Vitals  Encounter Vitals Group     BP 11/05/23 1324 (!) 168/74     Systolic BP Percentile --      Diastolic BP Percentile --      Pulse Rate 11/05/23 1324 73     Resp 11/05/23 1324 (!) 24     Temp 11/05/23 1324 97.9 F (36.6 C)      Temp Source 11/05/23 1324 Oral     SpO2 11/05/23 1324 (!) 86 %     Weight --      Height --      Head Circumference --      Peak Flow --      Pain Score 11/05/23 1326 0     Pain Loc --      Pain Education --      Exclude from Growth Chart --    No data found.  Updated Vital Signs BP (!) 168/74 (BP Location: Right Arm)   Pulse 80   Temp 97.9 F (36.6 C) (Oral)   Resp (!) 22   SpO2 90% Comment: Post nebulizer. Patient reports his normal is 91-92%  Visual Acuity Right Eye Distance:   Left Eye Distance:   Bilateral Distance:    Right Eye Near:   Left Eye Near:    Bilateral Near:     Physical Exam Vitals and nursing note reviewed.  Constitutional:      General: He is in acute distress.     Appearance: He is ill-appearing.  Cardiovascular:     Rate and Rhythm: Normal rate and regular rhythm.     Heart sounds: Normal heart sounds.  Pulmonary:     Effort: Tachypnea, accessory muscle usage and respiratory distress present.     Breath sounds: Decreased air movement present. Wheezing and rhonchi present.  Musculoskeletal:        General: Normal range of motion.  Skin:    General: Skin is warm and dry.  Psychiatric:        Mood and Affect: Mood normal.      UC Treatments / Results  Labs (all labs ordered are listed, but only abnormal results are displayed) Labs Reviewed - No data to display  EKG   Radiology No results found.  Procedures Procedures (including critical care time)  Medications Ordered in UC Medications  ipratropium-albuterol (DUONEB) 0.5-2.5 (3) MG/3ML nebulizer solution 3 mL (3 mLs Nebulization Given 11/05/23 1333)    Initial Impression / Assessment and Plan / UC Course  I have reviewed the triage vital signs and the nursing notes.  Pertinent labs & imaging results that were available during my care of the patient were reviewed by me and considered in my medical decision making (see chart for details).     Shortness of breath-patient  arrived today in acute respiratory distress with oxygen saturations around 86%, tachypneic and using accessory muscles. Hx of COPD managed by pulmonology.  Lung sounds reveal rhonchi and wheezing. DuoNeb did help improve breathing slightly.  Oxygen sats went up to 90%. Patient has  steroid injection by his PCP Tuesday without much relief.  Feels like his symptoms are getting worse We will go ahead and cover for underlying respiratory infection and inflammation Sending home with nebulizer to use every 6 hours as needed for cough, wheezing, shortness of breath.  Azithromycin and prednisone. Recommend for any worsening symptoms he will need to go to the ER. Final Clinical Impressions(s) / UC Diagnoses   Final diagnoses:  SOB (shortness of breath)     Discharge Instructions      COPD exacerbation and possible infection in the lungs. You can do the albuterol treatments every 4-6 hours for cough, wheezing or shortness of breath Take the medication as prescribed. If symptoms worsen and your breathing becomes worse she will need to go to the ER     ED Prescriptions     Medication Sig Dispense Auth. Provider   albuterol (PROVENTIL) (2.5 MG/3ML) 0.083% nebulizer solution Take 3 mLs (2.5 mg total) by nebulization every 6 (six) hours as needed for wheezing or shortness of breath. 75 mL Rosali Augello A, FNP   amoxicillin-clavulanate (AUGMENTIN) 875-125 MG tablet Take 1 tablet by mouth every 12 (twelve) hours. 14 tablet Ramina Hulet A, FNP   predniSONE (DELTASONE) 20 MG tablet Take 2 tablets (40 mg total) by mouth daily with breakfast for 5 days. 10 tablet Janace Aris, FNP      PDMP not reviewed this encounter.   Janace Aris, FNP 11/05/23 1524

## 2023-11-05 NOTE — ED Notes (Signed)
 Instructed patient on use of home nebulizer with understanding verbalized.

## 2024-03-12 ENCOUNTER — Ambulatory Visit
Admission: RE | Admit: 2024-03-12 | Discharge: 2024-03-12 | Disposition: A | Discharge status: Home / Self Care | Source: Ambulatory Visit | Attending: Acute Care | Admitting: Acute Care

## 2024-03-12 DIAGNOSIS — Z122 Encounter for screening for malignant neoplasm of respiratory organs: Secondary | ICD-10-CM

## 2024-03-12 DIAGNOSIS — Z87891 Personal history of nicotine dependence: Secondary | ICD-10-CM

## 2024-03-12 DIAGNOSIS — F17210 Nicotine dependence, cigarettes, uncomplicated: Secondary | ICD-10-CM

## 2024-03-12 DIAGNOSIS — R911 Solitary pulmonary nodule: Secondary | ICD-10-CM

## 2024-03-13 ENCOUNTER — Encounter: Payer: Self-pay | Admitting: Acute Care

## 2024-03-21 ENCOUNTER — Telehealth: Payer: Self-pay

## 2024-03-21 NOTE — Telephone Encounter (Signed)
 Patient called in and left VM for us . Called and left VM for pt.

## 2024-03-21 NOTE — Telephone Encounter (Signed)
 Results reviewed by Ruthell, NP. Please review results below. Due to nodule growth provider request a 3 month follow up scan due 06/12/2024. Please place 3 month follow up CT order. Message has been sent to Dr. Sabas to order CT to evaluate the adrenal nodules by Ruthell, NP, confirm his office will order scan.   IMPRESSION: Lung-RADS 3, probably benign findings. 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 Recommend further assessment with dedicated adrenal protocol CT with washout.   New left upper lobe mucostasis suggestive of on going infectious/inflammatory process.

## 2024-03-21 NOTE — Telephone Encounter (Signed)
 Called and left VM for pt

## 2024-03-22 NOTE — Telephone Encounter (Signed)
 Patient called back and in and left another VM. Called patient back and had to leave a vm. Advised on pt's VM to let us  know a good time to reach him if he calls us  back and gets our VM again.

## 2024-03-25 ENCOUNTER — Other Ambulatory Visit: Payer: Self-pay

## 2024-03-25 DIAGNOSIS — R911 Solitary pulmonary nodule: Secondary | ICD-10-CM

## 2024-03-25 DIAGNOSIS — Z122 Encounter for screening for malignant neoplasm of respiratory organs: Secondary | ICD-10-CM

## 2024-03-25 DIAGNOSIS — Z87891 Personal history of nicotine dependence: Secondary | ICD-10-CM

## 2024-03-25 NOTE — Telephone Encounter (Signed)
 Spoke with patient and reviewed recent Lung CT results. He is in agreement to complete a follow up Lung CT in 3 months. Order placed. He is aware that he will get a call closer to the due date. He has a f/u appt 04/08/2024 with his PCP, [place] and will discuss the adrenal nodules at that time and discuss if additional imaging is warranted. Results and plan have been sent to the PCP.

## 2024-04-08 LAB — LAB REPORT - SCANNED: EGFR: 98

## 2024-04-15 ENCOUNTER — Other Ambulatory Visit (HOSPITAL_BASED_OUTPATIENT_CLINIC_OR_DEPARTMENT_OTHER): Payer: Self-pay | Admitting: Family Medicine

## 2024-04-15 ENCOUNTER — Ambulatory Visit (INDEPENDENT_AMBULATORY_CARE_PROVIDER_SITE_OTHER)
Admission: RE | Admit: 2024-04-15 | Discharge: 2024-04-15 | Disposition: A | Discharge status: Home / Self Care | Source: Ambulatory Visit | Attending: Family Medicine | Admitting: Family Medicine

## 2024-04-15 DIAGNOSIS — M898X6 Other specified disorders of bone, lower leg: Secondary | ICD-10-CM

## 2024-04-15 DIAGNOSIS — M79662 Pain in left lower leg: Secondary | ICD-10-CM | POA: Diagnosis not present

## 2024-05-29 ENCOUNTER — Other Ambulatory Visit: Payer: Self-pay | Admitting: Family Medicine

## 2024-05-29 DIAGNOSIS — E279 Disorder of adrenal gland, unspecified: Secondary | ICD-10-CM

## 2024-06-12 ENCOUNTER — Other Ambulatory Visit

## 2024-06-12 ENCOUNTER — Ambulatory Visit
Admission: RE | Admit: 2024-06-12 | Discharge: 2024-06-12 | Disposition: A | Discharge status: Home / Self Care | Source: Ambulatory Visit | Attending: Family Medicine | Admitting: Family Medicine

## 2024-06-12 ENCOUNTER — Inpatient Hospital Stay
Admission: RE | Admit: 2024-06-12 | Discharge: 2024-06-12 | Disposition: A | Source: Ambulatory Visit | Attending: Acute Care | Admitting: Acute Care

## 2024-06-12 DIAGNOSIS — R911 Solitary pulmonary nodule: Secondary | ICD-10-CM

## 2024-06-12 DIAGNOSIS — Z122 Encounter for screening for malignant neoplasm of respiratory organs: Secondary | ICD-10-CM

## 2024-06-12 DIAGNOSIS — Z87891 Personal history of nicotine dependence: Secondary | ICD-10-CM

## 2024-06-12 DIAGNOSIS — E279 Disorder of adrenal gland, unspecified: Secondary | ICD-10-CM

## 2024-06-12 MED ORDER — IOPAMIDOL (ISOVUE-370) INJECTION 76%
75.0000 mL | Freq: Once | INTRAVENOUS | Status: AC | PRN
  Administered 2024-06-12: 75 mL via INTRAVENOUS

## 2024-06-17 ENCOUNTER — Telehealth: Payer: Self-pay | Admitting: *Deleted

## 2024-06-17 NOTE — Telephone Encounter (Signed)
 Call report from [facility]:  IMPRESSION: 1. Lung-RADS 4A, suspicious. Follow up low-dose chest CT without contrast in 3 months (please use the following order, CT CHEST LCS NODULE FOLLOW-UP W/O CM) is recommended. Alternatively, PET may be considered when there is a solid component 8mm or larger. New 6.3 mm left upper lobe pulmonary nodule, present in the setting of tree-in-bud and ground-glass nodularity. Tree-in-bud nodularity may represent an infectious or inflammatory etiology. 2. Progressive left upper lobe bronchus narrowing with circumferential soft tissue density at the hilum, causing short-segment bronchial occlusion. There is progressive bronchial filling within left upper lobe bronchus with few rounded densities, probable retained secretions. Hilar regions are not well assessed in the absence of IV contrast, and contrast enhanced CT or PET is recommended for further hilar evaluation to assess for potentially obstructing lesion. 3. The remaining pulmonary nodules are stable. 4. Indeterminate left adrenal nodule is unchanged from prior exam. Adrenal protocol CT recommended for characterization. This could be further assessed with PET, if obtained. 5. Coronary artery calcifications. 6. Aortic Atherosclerosis (ICD10-I70.0) and Emphysema (ICD10-J43.9).

## 2024-06-24 NOTE — Telephone Encounter (Signed)
 Results have been reviewed by Ruthell, NP. Awaiting recommendation from Dr. Shelah.

## 2024-06-26 ENCOUNTER — Telehealth: Payer: Self-pay | Admitting: Acute Care

## 2024-06-26 NOTE — Telephone Encounter (Signed)
 I have attempted to call the patient with the results of their  Low Dose CT Chest Lung cancer screening scan. There was no answer. I have left a HIPPA compliant VM requesting the patient call the office for the scan results. I included the office contact information in the message. We will await his return call. If no return call we will continue to call until patient is contacted.    Pt. Needs a PET scan to better evaluate the Progressive left upper lobe bronchus narrowing with circumferential soft tissue density at the hilum, causing short-segment bronchial occlusion. There is progressive bronchial filling within left upper lobe bronchus with few rounded densities, probable retained secretions. Scan was read as a 4 A, but we will get PET as there is concern for soft tissue density in the hilum. Once we get in touch with him, we can order the PET scan, and let PCP know.

## 2024-06-26 NOTE — Telephone Encounter (Signed)
 See provider note 06/26/2024, she has outreached patient and we are awaiting call back to schedule PET and review results.

## 2024-06-28 ENCOUNTER — Telehealth: Payer: Self-pay | Admitting: Acute Care

## 2024-06-28 NOTE — Telephone Encounter (Signed)
 I have attempted to call the patient with the results of their  Low Dose CT Chest Lung cancer screening scan. There was no answer. I have left a HIPPA compliant VM requesting the patient call the office for the scan results. I included the office contact information in the message. We will await his return call. If no return call we will continue to call until patient is contacted.    Ladies, can we mail a letter? Lets keep trying by phone, but his phone goes right to VM.

## 2024-07-01 NOTE — Telephone Encounter (Signed)
 LVM, sent letter. Attempted to reach spouse, VM full.

## 2024-07-01 NOTE — Telephone Encounter (Signed)
 Letter sent. Previous encounter notes started 06/26/2024.

## 2024-07-02 ENCOUNTER — Other Ambulatory Visit: Payer: Self-pay

## 2024-07-02 NOTE — Telephone Encounter (Signed)
 Called patient and reviewed recent Lung CT results. Patient states he met with his PCP recently and results were reviewed. States PCP reviewed recent results and said everything looks good and he would not do anything. I asked if his PCP reviewed Lung CT not his Abdominal CT results, and he said he was not sure. Pt is also hesitant complete the PET scan due to cost unless it is absolutely necessary.   Lauraine, can you call the patient tomorrow after clinic (Wednesday 11/12)? I think he may need to speak with you to completely understand the situation and clarify some of the confusion.   Silvano, can you check the OOP cost for a PET for him?  Thanks  Isaiah

## 2024-07-02 NOTE — Telephone Encounter (Signed)
 This is only an estimate:

## 2024-07-03 ENCOUNTER — Telehealth: Payer: Self-pay | Admitting: Acute Care

## 2024-07-03 ENCOUNTER — Other Ambulatory Visit: Payer: Self-pay | Admitting: Acute Care

## 2024-07-03 DIAGNOSIS — R911 Solitary pulmonary nodule: Secondary | ICD-10-CM

## 2024-07-03 NOTE — Telephone Encounter (Signed)
 I have called and spoken with the patient. I explained that his LDCT was read as a 4 A, suspicious. There is a progressive left upper lobe bronchus narrowing with circumferential soft tissue density at the hilum, causing short-segment bronchial occlusion. There is progressive bronchial filling within left upper lobe bronchus with few rounded densities, probable retained secretions. Hilar regions are not well assessed in the absence of IV contrast, and contrast enhanced CT or PET is recommended for further hilar evaluation to assess for potentially obstructing lesion.  I explained that we need to do some additional imaging to better evaluate this finding. We will do a CT Chest with contrast, as the nodule is < 8 mm and may be too small for PET avidity.   Both the patient and his wife were in agreement with the plan. I have ordered the CT Chest with contrast. Recent BMET showed a creatine of  0.67 on 03/2024.  Please fax results to PCP and let them know plan of care. Thanks so much. He will need a follow up appointment with me in the office 1-2 weeks after the scan.  Thanks so much

## 2024-07-03 NOTE — Telephone Encounter (Signed)
 I have attempted to call the patient with the results of their  Low Dose CT Chest Lung cancer screening scan. There was no answer. I have left a HIPPA compliant VM requesting the patient call the office for the scan results. I included the office contact information in the message. We will await his return call. If no return call we will continue to call until patient is contacted.    Please let me know if he calls back. Thanks so much

## 2024-07-03 NOTE — Telephone Encounter (Signed)
 Called and spoke with spouse.  She will have patient call us  back to discuss results.

## 2024-07-04 NOTE — Telephone Encounter (Signed)
Results and plan faxed to PCP ?

## 2024-07-04 NOTE — Telephone Encounter (Signed)
 Returned call to patient to see if he needed anything else as SG spoke with him yesterday pm. Pt states he is all set up NFN. Copied from CRM [id number]. Topic: Clinical - Lab/Test Results >> Jul 03, 2024  1:36 PM Isabell A wrote: Reason for CRM: Patient returning phone call for LCS results.   Callback number: [phone]

## 2024-07-04 NOTE — Telephone Encounter (Signed)
 See provider note 07/03/2024, CT schedule for 11/18

## 2024-07-09 ENCOUNTER — Ambulatory Visit (INDEPENDENT_AMBULATORY_CARE_PROVIDER_SITE_OTHER)
Admission: RE | Admit: 2024-07-09 | Discharge: 2024-07-09 | Disposition: A | Discharge status: Home / Self Care | Source: Ambulatory Visit | Attending: Acute Care | Admitting: Acute Care

## 2024-07-09 DIAGNOSIS — R911 Solitary pulmonary nodule: Secondary | ICD-10-CM | POA: Diagnosis not present

## 2024-07-09 MED ORDER — IOHEXOL 300 MG/ML  SOLN
100.0000 mL | Freq: Once | INTRAMUSCULAR | Status: AC | PRN
  Administered 2024-07-09: 60 mL via INTRAVENOUS

## 2024-07-26 ENCOUNTER — Ambulatory Visit: Admitting: Acute Care

## 2024-07-26 ENCOUNTER — Encounter: Payer: Self-pay | Admitting: Acute Care

## 2024-07-26 ENCOUNTER — Other Ambulatory Visit: Payer: Self-pay

## 2024-07-26 VITALS — BP 147/61 | HR 62 | Temp 97.6°F | Ht 70.0 in | Wt 221.2 lb

## 2024-07-26 DIAGNOSIS — R918 Other nonspecific abnormal finding of lung field: Secondary | ICD-10-CM

## 2024-07-26 DIAGNOSIS — Z122 Encounter for screening for malignant neoplasm of respiratory organs: Secondary | ICD-10-CM

## 2024-07-26 DIAGNOSIS — F17200 Nicotine dependence, unspecified, uncomplicated: Secondary | ICD-10-CM

## 2024-07-26 DIAGNOSIS — R911 Solitary pulmonary nodule: Secondary | ICD-10-CM

## 2024-07-26 DIAGNOSIS — F17210 Nicotine dependence, cigarettes, uncomplicated: Secondary | ICD-10-CM | POA: Diagnosis not present

## 2024-07-26 DIAGNOSIS — J441 Chronic obstructive pulmonary disease with (acute) exacerbation: Secondary | ICD-10-CM

## 2024-07-26 DIAGNOSIS — J449 Chronic obstructive pulmonary disease, unspecified: Secondary | ICD-10-CM

## 2024-07-26 DIAGNOSIS — R9389 Abnormal findings on diagnostic imaging of other specified body structures: Secondary | ICD-10-CM

## 2024-07-26 DIAGNOSIS — Z87891 Personal history of nicotine dependence: Secondary | ICD-10-CM

## 2024-07-26 MED ORDER — BREZTRI AEROSPHERE 160-9-4.8 MCG/ACT IN AERO: 2.0000 | INHALATION_SPRAY | Freq: Two times a day (BID) | RESPIRATORY_TRACT | Status: AC

## 2024-07-26 NOTE — Progress Notes (Signed)
 History of Present Illness Ryan Combs is a 73 y.o. male current every day smoker followed through the lung cancer screening program , here for consult due to a abnormal lung cancer screening scan. He will be followed by Dr. Shelah, and Livvy Spilman NP.    07/26/2024 Discussed the use of AI scribe software for clinical note transcription with the patient, who gave verbal consent to proceed.  History of Present Illness Pt. Presents for follow up after abnormal LDCT was read as a 4 A, suspicious. The LDCT  scan was done 05/2024. This showed a progressive left upper lobe bronchus narrowing with circumferential soft tissue density at the hilum, causing short-segment bronchial occlusion, which is progressive. There was progressive bronchial filling within left upper lobe bronchus with few rounded densities, probable retained secretions.   I explained that we need to do some additional imaging to better evaluate this finding. We did a  CT Chest with contrast, as the nodule is < 8 mm and may be too small for PET avidity. Pt. Is here to follow up and to review the scan.   We have reviewed patient's CT Chest with contrast that was done 07/09/2024. It showed soft tissue fullness in the left hilum is associated with impaction of central left upper lobe airways. These changes are  progressive in the interval, and were thought to be associated with infectious/inflammatory etiology, however,  central neoplasm cannot be excluded.  The tree in bud nodularity in the left upper lobe shows waxing and waning nodules, and is again thought to be infectious or inflammatory. There are scattered bilateral pulmonary nodules measuring up to 7 mm.  We will initiate Mucinex 1200 mg daily with a full glass of water and a flutter valve to thin and mobilize the secretions in his airways.  Pt. Has had 3 COPD Flares this year. Last antibiotics were about a month ago, with a prednisone  taper. He is using Advair as maintenance,states  he rarely uses his rescue and has not had PFT's ever.   We discussed a therapeutic trial with Breztri , as I feel he would benefit from triple therapy. We will order PFT's to evaluate for COPD/ and degree of disease. Pt. Would like to try the Breztri . He understands he needs to stop his Advair while using Breztri . If he feels this helps improve his symptoms, we will send in a prescription.  We will consider adding Ohtuvayre  at follow-up.  Test Results: 07/09/2024 CT Chest with Contrast  Soft tissue fullness in the left hilum associated with impaction of central left upper lobe airways. Probable 16 mm short axis left hilar node. These changes are progressive in the interval and may be infectious/inflammatory but close follow-up recommended as central neoplasm not excluded. 2. Tree-in-bud nodularity periphery of the left upper lobe with slightly different distribution than on the prior study. Given the waxing and waning appearance, imaging features are most suggestive of infectious/inflammatory etiology including atypical etiology. Close follow-up warranted. 3. Scattered bilateral pulmonary nodules measuring up to 7 mm, similar to prior. Continued close follow-up warranted. 4. Aortic Atherosclerosis (ICD10-I70.0) and Emphysema (ICD10-J43.9).  CT Chest 06/17/2024 Lung-RADS 4A, suspicious. Follow up low-dose chest CT without contrast in 3 months (please use the following order, CT CHEST LCS NODULE FOLLOW-UP W/O CM) is recommended. Alternatively, PET may be considered when there is a solid component 8mm or larger. New 6.3 mm left upper lobe pulmonary nodule, present in the setting of tree-in-bud and ground-glass nodularity. Tree-in-bud nodularity may represent an infectious  or inflammatory etiology. 2. Progressive left upper lobe bronchus narrowing with circumferential soft tissue density at the hilum, causing short-segment bronchial occlusion. There is progressive bronchial filling within  left upper lobe bronchus with few rounded densities, probable retained secretions. Hilar regions are not well assessed in the absence of IV contrast, and contrast enhanced CT or PET is recommended for further hilar evaluation to assess for potentially obstructing lesion. 3. The remaining pulmonary nodules are stable. 4. Indeterminate left adrenal nodule is unchanged from prior exam. Adrenal protocol CT recommended for characterization. This could be further assessed with PET, if obtained. 5. Coronary artery calcifications. 6. Aortic Atherosclerosis (ICD10-I70.0) and Emphysema (ICD10-J43.9).  03/12/2024 CT Chest Lung-RADS 3, probably benign findings. Short-term follow-up in 6 months is recommended with repeat low-dose chest CT without contrast (please use the following order, CT CHEST LCS NODULE FOLLOW-UP W/O CM).   Two left adrenal nodules with characteristics of lipid rich adenoma and an indeterminate nodule. Recommend further assessment with dedicated adrenal protocol CT with washout.   New left upper lobe mucostasis suggestive of on going infectious/inflammatory process.        Latest Ref Rng & Units 10/06/2020    4:00 PM 09/24/2018    2:25 PM  CBC  WBC 4.0 - 10.5 K/uL 11.2  9.9   Hemoglobin 13.0 - 17.0 g/dL 86.9 - 82.9 g/dL 85.4    85.3  86.5   Hematocrit 39.0 - 52.0 % 39.0 - 52.0 % 45.4    43.0  41.3   Platelets 150 - 400 K/uL 225  303        Latest Ref Rng & Units 10/06/2020    4:00 PM  BMP  Glucose 70 - 99 mg/dL 70 - 99 mg/dL 869    871   BUN 8 - 23 mg/dL 8 - 23 mg/dL 11    13   Creatinine 9.38 - 1.24 mg/dL 9.38 - 8.75 mg/dL 9.11    9.19   Sodium 864 - 145 mmol/L 135 - 145 mmol/L 140    141   Potassium 3.5 - 5.1 mmol/L 3.5 - 5.1 mmol/L 3.6    3.6   Chloride 98 - 111 mmol/L 98 - 111 mmol/L 103    104   CO2 22 - 32 mmol/L 27   Calcium  8.9 - 10.3 mg/dL 8.8     BNP No results found for: BNP  ProBNP No results found for: PROBNP  PFT No  results found for: FEV1PRE, FEV1POST, FVCPRE, FVCPOST, TLC, DLCOUNC, PREFEV1FVCRT, PSTFEV1FVCRT  CT Chest W Contrast Result Date: 07/14/2024 CLINICAL DATA:  Pulmonary nodules. EXAM: CT CHEST WITH CONTRAST TECHNIQUE: Multidetector CT imaging of the chest was performed during intravenous contrast administration. RADIATION DOSE REDUCTION: This exam was performed according to the departmental dose-optimization program which includes automated exposure control, adjustment of the mA and/or kV according to patient size and/or use of iterative reconstruction technique. CONTRAST:  60mL OMNIPAQUE  IOHEXOL  300 MG/ML  SOLN COMPARISON:  Lung cancer screening chest CT 06/12/2024 FINDINGS: Cardiovascular: The heart size is normal. No substantial pericardial effusion. Moderate atherosclerotic calcification is noted in the wall of the thoracic aorta. Mediastinum/Nodes: No mediastinal lymphadenopathy. Soft tissue fullness in the left hilum (image 53/301) is associated with impaction of central left upper lobe airways. Probable 16 mm short axis left hilar node on 54/301. 8 mm short axis right hilar node on 58/301. The esophagus has normal imaging features. Similar borderline enlarged lymph nodes in the axillary regions, left greater than right. Lungs/Pleura: Centrilobular and  paraseptal emphysema evident. Bronchial wall thickening noted bilaterally. Scattered pulmonary nodules again noted. 6.3 mm left upper lobe nodule identified as new on the previous study has resolved in the interval. 7 mm left upper lobe nodule seen on 73/302 today is similar to prior. Index 7 mm nodule right lower lobe on 105/302 is similar to prior. Tree-in-bud nodularity seen periphery of the left upper lobe with slightly different distribution than on the prior study. No dense focal consolidative airspace disease. No substantial pleural effusion. Upper Abdomen: Small area of low attenuation in the anterior liver, adjacent to the falciform  ligament, is in a characteristic location for focal fatty deposition/anomalous perfusion. Nodularity of the left adrenal gland is stable, better characterized on dedicated CT abdomen study 06/12/2024 as consistent with adenoma. Musculoskeletal: No worrisome lytic or sclerotic osseous abnormality. IMPRESSION: 1. Soft tissue fullness in the left hilum associated with impaction of central left upper lobe airways. Probable 16 mm short axis left hilar node. These changes are progressive in the interval and may be infectious/inflammatory but close follow-up recommended as central neoplasm not excluded. 2. Tree-in-bud nodularity periphery of the left upper lobe with slightly different distribution than on the prior study. Given the waxing and waning appearance, imaging features are most suggestive of infectious/inflammatory etiology including atypical etiology. Close follow-up warranted. 3. Scattered bilateral pulmonary nodules measuring up to 7 mm, similar to prior. Continued close follow-up warranted. 4. Aortic Atherosclerosis (ICD10-I70.0) and Emphysema (ICD10-J43.9). Electronically Signed   By: Camellia Candle M.D.   On: 07/14/2024 07:26     Past medical hx Past Medical History:  Diagnosis Date   Angio-edema    High blood pressure    Tobacco abuse      Social History   Tobacco Use   Smoking status: Every Day    Current packs/day: 2.00    Average packs/day: 2.0 packs/day for 57.9 years (115.9 ttl pk-yrs)    Types: Cigarettes    Start date: 1968   Smokeless tobacco: Never   Tobacco comments:    Pt states he still smokes 1 ppd 07/26/2024 KRD  Substance Use Topics   Alcohol use: Never   Drug use: Never    Mr.Reichenbach reports that he has been smoking cigarettes. He started smoking about 57 years ago. He has a 115.9 pack-year smoking history. He has never used smokeless tobacco. He reports that he does not drink alcohol and does not use drugs.  Tobacco Cessation: Ready to quit: Not  Answered Counseling given: Not Answered Tobacco comments: Pt states he still smokes 1 ppd 07/26/2024 KRD Current everyday smoker currently smoking 1 to 1-1/2 packs/day.  Patient has been counseled for 3 minutes today on smoking cessation.  He has been provided with resources to help him quit smoking.  Please see AVS  Past surgical hx, Family hx, Social hx all reviewed.  Current Outpatient Medications on File Prior to Visit  Medication Sig   albuterol  (PROVENTIL ) (2.5 MG/3ML) 0.083% nebulizer solution Take 3 mLs (2.5 mg total) by nebulization every 6 (six) hours as needed for wheezing or shortness of breath.   albuterol  (VENTOLIN  HFA) 108 (90 Base) MCG/ACT inhaler Inhale 1-2 puffs into the lungs every 6 (six) hours as needed for wheezing or shortness of breath.   amLODipine (NORVASC) 5 MG tablet Take 5 mg by mouth daily.   clonazePAM (KLONOPIN) 1 MG tablet Take 1 mg by mouth 3 (three) times daily as needed for anxiety.   EPINEPHrine  0.3 mg/0.3 mL IJ SOAJ injection Use as  directed for life threatening allergic reactions only   fluticasone-salmeterol (ADVAIR) 250-50 MCG/ACT AEPB Inhale 1 puff into the lungs 2 (two) times daily as needed (asthma).   loratadine (CLARITIN) 10 MG tablet Take 10 mg by mouth daily.   sildenafil (VIAGRA) 100 MG tablet Take 100 mg by mouth daily as needed for erectile dysfunction.   No current facility-administered medications on file prior to visit.     Allergies  Allergen Reactions   Aspirin Anaphylaxis    Review Of Systems:  Constitutional:   No  weight loss, night sweats,  Fevers, chills, fatigue, or  lassitude.  HEENT:   No headaches,  Difficulty swallowing,  Tooth/dental problems, or  Sore throat,                No sneezing, itching, ear ache, nasal congestion, post nasal drip,   CV:  No chest pain,  Orthopnea, PND, swelling in lower extremities, anasarca, dizziness, palpitations, syncope.   GI  No heartburn, indigestion, abdominal pain, nausea,  vomiting, diarrhea, change in bowel habits, loss of appetite, bloody stools.   Resp: + shortness of breath with exertion or at rest.  + baseline excess mucus, + baseline  productive cough,  + baseline non-productive cough,  No coughing up of blood.  No change in color of mucus.  + Baseline occasional wheezing.  No chest wall deformity, states coughing and congestion are worse early in the morning  Skin: no rash or lesions.  GU: no dysuria, change in color of urine, no urgency or frequency.  No flank pain, no hematuria   MS:  No joint pain or swelling.  No decreased range of motion.  No back pain.  Psych:  No change in mood or affect. No depression or anxiety.  No memory loss.   Vital Signs BP (!) 147/61   Pulse 62   Temp 97.6 F (36.4 C) (Oral)   Ht 5' 10 (1.778 m)   Wt 221 lb 3.2 oz (100.3 kg)   SpO2 95%   BMI 31.74 kg/m    Physical Exam:  General- No distress,  A&Ox3, pleasant and appropriate ENT: No sinus tenderness, TM clear, pale nasal mucosa, no oral exudate,no post nasal drip, no LAN Cardiac: S1, S2, regular rate and rhythm, no murmur Chest: No wheeze/ rales/ dullness; no accessory muscle use, no nasal flaring, no sternal retractions, diminished per bases, positive rhonchi which clears with cough, overall sounds congested Abd.: Soft Non-tender, nondistended, bowel sounds positive,Body mass index is 31.74 kg/m.  Ext: No clubbing cyanosis, edema, no obvious deformities Neuro:  normal strength, moving all extremities x 4, alert and oriented x 3, Skin: No rashes, warm and dry, no obvious skin lesions Psych: normal mood and behavior  Physical Exam    Assessment/Plan Current everyday smoker follow-up to the lung cancer screening program Pulmonary nodules under surveillance Impacted pulmonary Airways Suspected COPD, 3 flares in 2025 Plan We have reviewed your CT chest , which indicates mucus impaction in multiple airways in your lungs. Please start taking Mucinex 1200  mg every morning with a full glass of water to help thin your secretions. We will provide you with a flutter valve today which should help mobilize the secretions. Use the flutter valve for to 6 blows at a time, 4-6 times a day. Use this for chest congestion to see if we can get the secretions in your lungs up and out. We will do a 36-month follow-up CT chest to see if this is helping to clear  your airways. This will be due February 2026. You will follow-up with me 1 to 2 weeks after the scan to review the results. I have also ordered pulmonary function testing so that we can stage her COPD. You will do a follow-up visit after so we can review the results. We will do a therapeutic trial with Breztri , this inhaler contains 3 medications for COPD. Take 2 puffs in the morning, and 2 puffs in the evening. Rinse your mouth after use. Hold Advair while using Breztri . If you find this medication is beneficial please call for prescription. If you find the medication is not beneficial and you wish to return to your Advair that is also fine. Work on quitting smoking. You can receive free nicotine replacement therapy (patches, gum, or mints) by calling 1-800-QUIT NOW. Please call so we can get you on the path to becoming a non-smoker. I know it is hard, but you can do this!  Hypnosis for smoking cessation  [employer] [phone]  Acupuncture for smoking cessation  [employer] [phone]   Have a good holiday  I spent 25 minutes dedicated to the care of this patient on the date of this encounter to include pre-visit review of records, face-to-face time with the patient discussing conditions above, post visit ordering of testing, clinical documentation with the electronic health record, making appropriate referrals as documented, and communicating necessary information to the patient's healthcare team.  Assessment & Plan        Lauraine JULIANNA Lites, NP 07/26/2024  6:52  PM

## 2024-07-26 NOTE — Patient Instructions (Signed)
 It is good to see you today. We have reviewed your CT chest , which indicates mucus impaction in multiple airways in your lungs. Please start taking Mucinex 1200 mg every morning with a full glass of water to help thin your secretions. We will provide you with a flutter valve today which should help mobilize the secretions. Use the flutter valve for to 6 blows at a time, 4-6 times a day. Use this for chest congestion to see if we can get the secretions in your lungs up and out. We will do a 62-month follow-up CT chest to see if this is helping to clear your airways. This will be due February 2026. You will follow-up with me 1 to 2 weeks after the scan to review the results. I have also ordered pulmonary function testing so that we can stage her COPD. You will do a follow-up visit after so we can review the results. We will do a therapeutic trial with Breztri , this inhaler contains 3 medications for COPD. Take 2 puffs in the morning, and 2 puffs in the evening. Rinse your mouth after use. Hold Advair while using Breztri . If you find this medication is beneficial please call for prescription. If you find the medication is not beneficial and you wish to return to your Advair that is also fine. Work on quitting smoking. You can receive free nicotine replacement therapy (patches, gum, or mints) by calling 1-800-QUIT NOW. Please call so we can get you on the path to becoming a non-smoker. I know it is hard, but you can do this!  Hypnosis for smoking cessation  [employer] [phone]  Acupuncture for smoking cessation  [employer] [phone]   Have a good holiday

## 2024-08-12 ENCOUNTER — Telehealth: Payer: Self-pay

## 2024-08-12 NOTE — Telephone Encounter (Signed)
 Copied from CRM #[id number]. Topic: General - Other >> Aug 09, 2024  3:27 PM Russell PARAS wrote: Reason for CRM:   [place], with [facility](referring PCP), is contacting clinic to request copy of OV note from 07/26/2024  FX#  [phone]   This has been faxed successfully.

## 2024-09-10 ENCOUNTER — Encounter: Payer: Self-pay | Admitting: Acute Care

## 2024-09-11 ENCOUNTER — Encounter

## 2024-10-09 ENCOUNTER — Ambulatory Visit (HOSPITAL_BASED_OUTPATIENT_CLINIC_OR_DEPARTMENT_OTHER): Admitting: Radiology
